# Patient Record
Sex: Male | Born: 1955 | Race: Black or African American | Hispanic: No | Marital: Married | Smoking: Current every day smoker
Health system: Southern US, Community
[De-identification: ages and names within clinical notes are randomized; demographics above are authoritative.]

## PROBLEM LIST (undated history)

## (undated) DIAGNOSIS — F32A Depression, unspecified: Secondary | ICD-10-CM

## (undated) DIAGNOSIS — M109 Gout, unspecified: Secondary | ICD-10-CM

## (undated) DIAGNOSIS — I10 Essential (primary) hypertension: Secondary | ICD-10-CM

## (undated) DIAGNOSIS — F329 Major depressive disorder, single episode, unspecified: Secondary | ICD-10-CM

## (undated) DIAGNOSIS — R45851 Suicidal ideations: Secondary | ICD-10-CM

---

## 2005-03-18 ENCOUNTER — Observation Stay (HOSPITAL_COMMUNITY): Admission: EM | Admit: 2005-03-18 | Discharge: 2005-03-19 | Payer: Self-pay | Admitting: Emergency Medicine

## 2008-02-17 ENCOUNTER — Emergency Department (HOSPITAL_COMMUNITY): Admission: EM | Admit: 2008-02-17 | Discharge: 2008-02-17 | Payer: Self-pay | Admitting: Emergency Medicine

## 2011-02-21 NOTE — Cardiovascular Report (Signed)
NAMEJUNAH, Terry Hill            ACCOUNT NO.:  [id number]   MEDICAL RECORD NO.:  [id number]          PATIENT TYPE:  INP   LOCATION:  2025                         FACILITY:  [facility]   PHYSICIAN:  Nanetta Batty, M.D.   DATE OF BIRTH:  12/26/1955   DATE OF PROCEDURE:  03/19/2005  DATE OF DISCHARGE:                              CARDIAC CATHETERIZATION   [name] is a 55 year old gentleman without prior cardiac history who  was admitted June 13 with chest pain.  He is positive for ongoing tobacco  abuse, hyperlipidemia, and untreated hypertension.  He ruled out for  myocardial infarction by isoenzymes and presents now for diagnostic coronary  arteriography to rule out ischemic etiology.   PROCEDURE DESCRIPTION:  The patient was brought to the second floor [facility] in the postabsorptive state.  He was  premedicated with  p.o. Valium and IV Versed.  His right groin was prepped  and shaved in the usual sterile fashion; 1% Xylocaine was used for local  anesthesia.  A 6-French sheath was inserted into the right femoral artery  using sterile Seldinger technique.  The 6-French right and left Judkins  diagnostic catheters along with 6-French pigtail catheter and no-torque  catheter were used for selective coronary angiography, left  ventriculography, and supravalvular aortography in the LAO view.  Visipaque  dye was used for the entirety of the case. Aorta and left ventricular  pressures were recorded.   HEMODYNAMICS:  1.  Aortic systolic pressure 183, diastolic pressure 99.  2.  Left ventricular systolic pressure 183 and diastolic pressure 23.   SELECTIVE CORONARY ANGIOGRAPHY:  1.  Left main normal.  2.  LAD normal.  3.  Left circumflex is codominant and normal.  4.  Ramus intermedius branch was large and normal.  5.  Right coronary artery was codominant and normal.  6.  There was a second ramus branch which came off from the circumflex      superior to  the larger branch that had approximately a 50% ostial      stenosis.  7.  Right coronary artery.  This vessel was codominant and normal.   LEFT VENTRICULOGRAPHY:  RAO:  Left ventriculography was performed using 25  mL of Visipaque dye at 10 mL per second.  The overall LVEF was 60% without  ventricular wall motion abnormalities.   Supravalvular aortography was performed in the LAO view using 20 mL of  Visipaque dye at 20 mL per second.  There was no AI noted.  There was no  evidence of aortic dissection.   IMPRESSION:  [name] has at most 60% blockage in a medium size ramus  branch which I do not think is of physiologic significance.  Otherwise his  coronary arteries are clean.  He has normal left ventricular function, no  evidence of aortic dissection.  His right femoral arteriotomy site was  closed using STARR closure of the right  system.  Hemostasis was obtained.  He will receive a gram of Ancef  prophylactically and will be discharged home later today with close office  followup.  We will arrange  for him to get outpatient exercise stress testing  prior to being seen back in the office.  He left the lab in stable  condition.       JB/MEDQ  D:  03/19/2005  T:  03/19/2005  Job:  [id number]   cc:   [facility]   [facility]  [address] [place] [address]

## 2014-02-23 DIAGNOSIS — I10 Essential (primary) hypertension: Secondary | ICD-10-CM | POA: Insufficient documentation

## 2014-09-08 ENCOUNTER — Encounter (HOSPITAL_COMMUNITY): Payer: Self-pay | Admitting: *Deleted

## 2014-09-08 ENCOUNTER — Encounter (HOSPITAL_COMMUNITY): Payer: Self-pay | Admitting: Emergency Medicine

## 2014-09-08 ENCOUNTER — Emergency Department (HOSPITAL_COMMUNITY)
Admission: EM | Admit: 2014-09-08 | Discharge: 2014-09-08 | Disposition: A | Discharge status: Other Acute Inpatient Hospital | Payer: 59 | Attending: Emergency Medicine | Admitting: Emergency Medicine

## 2014-09-08 ENCOUNTER — Inpatient Hospital Stay (HOSPITAL_COMMUNITY)
Admission: AD | Admit: 2014-09-08 | Discharge: 2014-09-12 | DRG: 885 | Disposition: A | Discharge status: Home / Self Care | Payer: 59 | Source: Intra-hospital | Attending: Psychiatry | Admitting: Psychiatry

## 2014-09-08 DIAGNOSIS — R45851 Suicidal ideations: Secondary | ICD-10-CM | POA: Diagnosis present

## 2014-09-08 DIAGNOSIS — F1420 Cocaine dependence, uncomplicated: Secondary | ICD-10-CM | POA: Diagnosis present

## 2014-09-08 DIAGNOSIS — F322 Major depressive disorder, single episode, severe without psychotic features: Secondary | ICD-10-CM | POA: Diagnosis not present

## 2014-09-08 DIAGNOSIS — F1410 Cocaine abuse, uncomplicated: Secondary | ICD-10-CM | POA: Insufficient documentation

## 2014-09-08 DIAGNOSIS — F1010 Alcohol abuse, uncomplicated: Secondary | ICD-10-CM | POA: Diagnosis not present

## 2014-09-08 DIAGNOSIS — F341 Dysthymic disorder: Secondary | ICD-10-CM | POA: Diagnosis present

## 2014-09-08 DIAGNOSIS — Z599 Problem related to housing and economic circumstances, unspecified: Secondary | ICD-10-CM | POA: Diagnosis not present

## 2014-09-08 DIAGNOSIS — G4700 Insomnia, unspecified: Secondary | ICD-10-CM | POA: Diagnosis present

## 2014-09-08 DIAGNOSIS — F1020 Alcohol dependence, uncomplicated: Secondary | ICD-10-CM | POA: Diagnosis present

## 2014-09-08 DIAGNOSIS — I10 Essential (primary) hypertension: Secondary | ICD-10-CM | POA: Diagnosis not present

## 2014-09-08 DIAGNOSIS — F17210 Nicotine dependence, cigarettes, uncomplicated: Secondary | ICD-10-CM | POA: Diagnosis present

## 2014-09-08 DIAGNOSIS — Z720 Tobacco use: Secondary | ICD-10-CM | POA: Insufficient documentation

## 2014-09-08 DIAGNOSIS — Y902 Blood alcohol level of 40-59 mg/100 ml: Secondary | ICD-10-CM | POA: Diagnosis present

## 2014-09-08 DIAGNOSIS — F329 Major depressive disorder, single episode, unspecified: Secondary | ICD-10-CM

## 2014-09-08 HISTORY — DX: Essential (primary) hypertension: I10

## 2014-09-08 LAB — CBC
HCT: 41.2 % (ref 39.0–52.0)
Hemoglobin: 13.7 g/dL (ref 13.0–17.0)
MCH: 29.7 pg (ref 26.0–34.0)
MCHC: 33.3 g/dL (ref 30.0–36.0)
MCV: 89.2 fL (ref 78.0–100.0)
Platelets: 230 10*3/uL (ref 150–400)
RBC: 4.62 MIL/uL (ref 4.22–5.81)
RDW: 13.6 % (ref 11.5–15.5)
WBC: 9 10*3/uL (ref 4.0–10.5)

## 2014-09-08 LAB — RAPID URINE DRUG SCREEN, HOSP PERFORMED
Amphetamines: NOT DETECTED
Barbiturates: NOT DETECTED
Benzodiazepines: NOT DETECTED
Cocaine: POSITIVE — AB
Opiates: NOT DETECTED
Tetrahydrocannabinol: NOT DETECTED

## 2014-09-08 LAB — COMPREHENSIVE METABOLIC PANEL
ALT: 25 U/L (ref 0–53)
AST: 21 U/L (ref 0–37)
Albumin: 4.6 g/dL (ref 3.5–5.2)
Alkaline Phosphatase: 73 U/L (ref 39–117)
Anion gap: 18 — ABNORMAL HIGH (ref 5–15)
BUN: 22 mg/dL (ref 6–23)
CO2: 23 mEq/L (ref 19–32)
Calcium: 9.9 mg/dL (ref 8.4–10.5)
Chloride: 96 mEq/L (ref 96–112)
Creatinine, Ser: 1.38 mg/dL — ABNORMAL HIGH (ref 0.50–1.35)
GFR calc Af Amer: 63 mL/min — ABNORMAL LOW (ref >90)
GFR calc non Af Amer: 54 mL/min — ABNORMAL LOW (ref >90)
Glucose, Bld: 96 mg/dL (ref 70–99)
Potassium: 4 mEq/L (ref 3.7–5.3)
Sodium: 137 mEq/L (ref 137–147)
Total Bilirubin: 0.5 mg/dL (ref 0.3–1.2)
Total Protein: 8.5 g/dL — ABNORMAL HIGH (ref 6.0–8.3)

## 2014-09-08 LAB — SALICYLATE LEVEL: Salicylate Lvl: <2 mg/dL — ABNORMAL LOW (ref 2.8–20.0)

## 2014-09-08 LAB — ACETAMINOPHEN LEVEL: Acetaminophen (Tylenol), Serum: <15 ug/mL (ref 10–30)

## 2014-09-08 LAB — ETHANOL: Alcohol, Ethyl (B): 51 mg/dL — ABNORMAL HIGH (ref 0–11)

## 2014-09-08 MED ORDER — LORAZEPAM 1 MG PO TABS: 0.0000 mg | ORAL_TABLET | Freq: Two times a day (BID) | ORAL | Status: DC

## 2014-09-08 MED ORDER — LORAZEPAM 1 MG PO TABS
0.0000 mg | ORAL_TABLET | Freq: Four times a day (QID) | ORAL | Status: DC
  Administered 2014-09-08 (×2): 1 mg via ORAL

## 2014-09-08 MED ORDER — ACETAMINOPHEN 325 MG PO TABS: 650.0000 mg | ORAL_TABLET | Freq: Four times a day (QID) | ORAL | Status: DC | PRN

## 2014-09-08 MED ORDER — TRAZODONE HCL 100 MG PO TABS: 100.0000 mg | ORAL_TABLET | Freq: Every day | ORAL | Status: DC

## 2014-09-08 MED ORDER — FLUOXETINE HCL 20 MG PO CAPS
20.0000 mg | ORAL_CAPSULE | Freq: Every day | ORAL | Status: DC
  Administered 2014-09-09 – 2014-09-11 (×3): 20 mg via ORAL
  Filled 2014-09-08 (×4): qty 1

## 2014-09-08 MED ORDER — ALUM & MAG HYDROXIDE-SIMETH 200-200-20 MG/5ML PO SUSP: 30.0000 mL | ORAL | Status: DC | PRN

## 2014-09-08 MED ORDER — IRBESARTAN 300 MG PO TABS
300.0000 mg | ORAL_TABLET | Freq: Every day | ORAL | Status: DC
  Administered 2014-09-09 – 2014-09-12 (×4): 300 mg via ORAL
  Filled 2014-09-08 (×6): qty 1

## 2014-09-08 MED ORDER — AMLODIPINE BESYLATE 10 MG PO TABS
10.0000 mg | ORAL_TABLET | Freq: Once | ORAL | Status: AC
  Administered 2014-09-08: 10 mg via ORAL
  Filled 2014-09-08: qty 1

## 2014-09-08 MED ORDER — TRAZODONE HCL 100 MG PO TABS
100.0000 mg | ORAL_TABLET | Freq: Every day | ORAL | Status: DC
  Administered 2014-09-08 – 2014-09-09 (×2): 100 mg via ORAL
  Filled 2014-09-08 (×2): qty 1
  Filled 2014-09-08: qty 2
  Filled 2014-09-08 (×3): qty 1

## 2014-09-08 MED ORDER — MAGNESIUM HYDROXIDE 400 MG/5ML PO SUSP: 30.0000 mL | Freq: Every day | ORAL | Status: DC | PRN

## 2014-09-08 MED ORDER — FLUOXETINE HCL 20 MG PO CAPS
20.0000 mg | ORAL_CAPSULE | Freq: Every day | ORAL | Status: DC
  Administered 2014-09-08: 20 mg via ORAL
  Filled 2014-09-08: qty 1

## 2014-09-08 MED ORDER — LORAZEPAM 1 MG PO TABS
0.0000 mg | ORAL_TABLET | Freq: Four times a day (QID) | ORAL | Status: AC
  Administered 2014-09-08 – 2014-09-09 (×5): 1 mg via ORAL
  Filled 2014-09-08 (×4): qty 1

## 2014-09-08 NOTE — ED Notes (Signed)
Report received from Jennifer RN. Pt. Sleeping, respirations regular and unlabored.  Will continue to monitor for safety. Q 15 minute checks continue. 

## 2014-09-08 NOTE — Progress Notes (Signed)
[facility] Minerva AreolaEric, [facility] CSW informed nursing staff of the updated disposition.    Maryelizabeth Rowanressa Manual Navarra, MSW, LCSWA Evening Clinical Social Worker [phone]

## 2014-09-08 NOTE — BH Assessment (Signed)
Assessment Note  Terry Hill is an 58 y.o. male. [facility] because of suicidal ideations with plan and intent.  Per the IVC report, respondent called his employer intoxicated threatening to kill self with a shot gun.  Patient admits to having SI with a gun laying in the bed with in contemplation to harm self.  Patient report he has never thought of harming self although having had symptoms of depression all his life.  Patient reports he had been sober since 2001 until 3 months ago with alcohol and tried snorting cocaine 2 days ago.  Patient reports drinking fifth of rum daily for the last 3 months and purchased a eight ball of cocaine 2 days ago. Patient currently denies SI/HI, hallucinations, and other self-injurious behaviors.    Patient reports his current stressors are his wife and [place] months ago without discussing with him. He reports having financial problems and cars broke down recently.  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    CSW consulted with Dr. Jannifer FranklinAkintayo it is recommended to refer inpatient for safety and stabilization.    Axis I: Major Depression, single episode and Alcohol use and Cocaine use mild Axis II: Deferred Axis III:  Past Medical History  Diagnosis Date  . Hypertension    Axis IV: economic problems, other psychosocial or environmental problems, problems related to social environment, problems with access to health care services and problems with primary support group Axis V: 41-50 serious symptoms  Past Medical History:  Past Medical History  Diagnosis Date  . Hypertension     No past surgical history on file.  Family History: No family history on file.  Social History:  reports that he has been smoking Cigarettes.  He has been smoking about 0.00 packs per day. He does not have any smokeless tobacco history on  file. He reports that he drinks alcohol. His drug history is not on file.  Additional Social History:     CIWA: CIWA-Ar BP: 167/86 mmHg Pulse Rate: 105 COWS:    Allergies:  Allergies  Allergen Reactions  . Sulfur Hives and Rash    Home Medications:  (Not in a hospital admission)  OB/GYN Status:  No LMP for male patient.  General Assessment Data Location of Assessment: [facility] ACT Assessment: Yes Is this a Tele or Face-to-Face Assessment?: Face-to-Face Is this an Initial Assessment or a Re-assessment for this encounter?: Initial Assessment Living Arrangements: Alone Can pt return to current living arrangement?: Yes Admission Status: Involuntary Is patient capable of signing voluntary admission?: No Transfer from: Home Referral Source: Self/Family/Friend  Medical Screening Exam [facility] Walk-in ONLY) Medical Exam completed: Yes  [facility] Living Arrangements: Alone Name of Psychiatrist: none Name of Therapist: none  Education Status Is patient currently in school?: No  Risk to self with the past 6 months Suicidal Ideation: No (Pt denies currently although had shot gun with intent) Suicidal Intent: No (Pt had shot gun load with inten but denies at this time) Is patient at risk for suicide?: Yes Suicidal Plan?: No (Shot load with intent) Access to Means: Yes Specify Access to Suicidal Means: shot in home What has been your use of drugs/alcohol within the last 12 months?: alcohol, cocaine Previous Attempts/Gestures: No Triggers for Past Attempts: Family contact, Spouse contact Intentional Self Injurious Behavior: None Family Suicide History: No Recent stressful  life event(s): Conflict (Comment), Loss (Comment), Financial Problems Persecutory voices/beliefs?: No Depression: Yes Depression Symptoms: Tearfulness, Guilt, Fatigue, Loss of interest in usual pleasures, Feeling angry/irritable, Feeling worthless/self pity Substance abuse history and/or treatment for  substance abuse?: Yes  Risk to Others within the past 6 months Homicidal Ideation: No-Not Currently/Within Last 6 Months Thoughts of Harm to Others: No-Not Currently Present/Within Last 6 Months Current Homicidal Intent: No-Not Currently/Within Last 6 Months Current Homicidal Plan: No-Not Currently/Within Last 6 Months History of harm to others?: No Assessment of Violence: None Noted Does patient have access to weapons?: Yes (Comment) Criminal Charges Pending?: No Does patient have a court date: No  Psychosis Hallucinations: None noted Delusions: None noted  Mental Status Report Appear/Hygiene: In hospital gown Eye Contact: Fair Motor Activity: Tremors Speech: Pressured Level of Consciousness: Alert Mood: Anxious, Depressed Affect: Anxious, Depressed Anxiety Level: Moderate Thought Processes: Coherent Judgement: Unimpaired Orientation: Person, Place, Time, Situation Obsessive Compulsive Thoughts/Behaviors: None  Cognitive Functioning Concentration: Fair Memory: Recent Intact, Remote Intact IQ: Average Insight: Fair Impulse Control: Fair Appetite: Fair Sleep: Decreased Vegetative Symptoms: None  ADLScreening [facility]) Patient's cognitive ability adequate to safely complete daily activities?: Yes Patient able to express need for assistance with ADLs?: Yes Independently performs ADLs?: Yes (appropriate for developmental age)  Prior Inpatient Therapy Prior Inpatient Therapy: No  Prior Outpatient Therapy Prior Outpatient Therapy: No  ADL Screening (condition at time of admission) Patient's cognitive ability adequate to safely complete daily activities?: Yes Patient able to express need for assistance with ADLs?: Yes Independently performs ADLs?: Yes (appropriate for developmental age)             Advance Directives (For Healthcare) Does patient have an advance directive?: No Would patient like information on creating an advanced directive?:  No - patient declined information    Additional Information 1:1 In Past 12 Months?: No CIRT Risk: No Elopement Risk: No Does patient have medical clearance?: Yes     Disposition:  Disposition Disposition of Patient: Other dispositions Other disposition(s): Other (Comment) (Pending disposition)  On Site Evaluation by:   Reviewed with Physician:    Maryelizabeth Rowanorbett, Karla Vines A 09/08/2014 10:06 AM

## 2014-09-08 NOTE — ED Notes (Signed)
[facility] [place] [facility] [place] [facility].  Per Deputy IVC papers are in process at this time. Pt told his boss this morning that he was going to kill himself and when sheriff dept arrived to pt's house, pt had loaded shot gun on his bed.   Pt states that last night "things just got too much and came to a head".  Pt denies having a plan or HI to me at this time.  Pt states that "here lately been using alcohol and tobacco everyday".

## 2014-09-08 NOTE — Tx Team (Signed)
Initial Interdisciplinary Treatment Plan   PATIENT STRESSORS: Marital or family conflict Substance abuse   PATIENT STRENGTHS: Ability for insight Average or above average intelligence Capable of independent living General fund of knowledge   PROBLEM LIST: Problem List/Patient Goals Date to be addressed Date deferred Reason deferred Estimated date of resolution  Depression 09/08/14     "drinking too much" 09/08/14                                                DISCHARGE CRITERIA:  Ability to meet basic life and health needs Improved stabilization in mood, thinking, and/or behavior Verbal commitment to aftercare and medication compliance Withdrawal symptoms are absent or subacute and managed without 24-hour nursing intervention  PRELIMINARY DISCHARGE PLAN: Attend aftercare/continuing care group Return to previous living arrangement  PATIENT/FAMIILY INVOLVEMENT: This treatment plan has been presented to and reviewed with the patient, Terry Hill, and/or family member, .  The patient and family have been given the opportunity to ask questions and make suggestions.  Genevie Elman, [place] 09/08/2014, 9:53 PM

## 2014-09-08 NOTE — ED Notes (Signed)
Patient is pleasant on approach today. Reports increased stress and some financial issues. Says he did have a moment where he thought about suicide but denies at present. Reports that he is struggling with ETOH and cocaine abuse. Reports some tremors at present. Patient started on ativan protocol. Cooperative on the unit.

## 2014-09-08 NOTE — Consult Note (Signed)
[place] [facility] Consult   Reason for Consult:  Suicidal ideation with plan to shoot self Referring Physician:  EDP  Hill Terry is an 58 y.o. male. Total Time spent with patient: 45 minutes  Assessment: AXIS I:  Major Depression, single episode AXIS II:  Deferred AXIS III:   Past Medical History  Diagnosis Date  . Hypertension    AXIS IV:  other psychosocial or environmental problems and problems related to social environment AXIS V:  31-40 impairment in reality testing  Plan:  Recommend psychiatric Inpatient admission when medically cleared.  Subjective:   Terry Hill is a 58 y.o. male patient admitted with suicidal ideation and a plan to shoot him self  HPI:  Mr. Terry Hill is a 58 year old African American Male who endorses a long history of dysthymia.  States "I have always been depressed"  Reports that yesterday he had a bad day and had thoughts to kill himself. Recent stressors include wife and family moving to [place] several months ago with patient's depressing worsening over this time.  [name] reports that he was ready to "end it all"  Patient had a loaded shot gun in bed with him and was thinking about pulling the trigger but called his boss at work  who contacted the sheriff.   He has been drinking alcohol to self medicate. Reports he drinks a fifth of liquor daily to every other day.  Reports tremors and feeling shaky this am.  Endorses some cocaine use recently. Patient endorses many symptoms of depression including anhedonia, worthlessness, guilt.  Denies Auditory or Visual hallucinations.  NO evidence of delusions HPI Elements:   Location:  generalized. Quality:  acute. Severity:  severe. Timing:  constant . Duration:  increasing in intensity for past three months. Context:  family moving to [place]. .  Past Psychiatric History: Past Medical History  Diagnosis Date  . Hypertension     reports that he has been smoking Cigarettes.  He has  been smoking about 0.00 packs per day. He does not have any smokeless tobacco history on file. He reports that he drinks alcohol. His drug history is not on file. No family history on file. Family History Substance Abuse: Yes, Describe: (alcohol, cocaine) Family Supports: Yes, List: (daughter) Living Arrangements: Alone Can pt return to current living arrangement?: Yes   Allergies:   Allergies  Allergen Reactions  . Sulfur Hives and Rash    ACT Assessment Complete:  Yes:    Educational Status    Risk to Self: Risk to self with the past 6 months Suicidal Ideation: No (Pt denies currently although had shot gun with intent) Suicidal Intent: No (Pt had shot gun load with inten but denies at this time) Is patient at risk for suicide?: Yes Suicidal Plan?: No (Shot load with intent) Access to Means: Yes Specify Access to Suicidal Means: shot in home What has been your use of drugs/alcohol within the last 12 months?: alcohol, cocaine Previous Attempts/Gestures: No Triggers for Past Attempts: Family contact, Spouse contact Intentional Self Injurious Behavior: None Family Suicide History: No Recent stressful life event(s): Conflict (Comment), Loss (Comment), Financial Problems Persecutory voices/beliefs?: No Depression: Yes Depression Symptoms: Tearfulness, Guilt, Fatigue, Loss of interest in usual pleasures, Feeling angry/irritable, Feeling worthless/self pity Substance abuse history and/or treatment for substance abuse?: Yes  Risk to Others: Risk to Others within the past 6 months Homicidal Ideation: No-Not Currently/Within Last 6 Months Thoughts of Harm to Others: No-Not Currently Present/Within Last  6 Months Current Homicidal Intent: No-Not Currently/Within Last 6 Months Current Homicidal Plan: No-Not Currently/Within Last 6 Months History of harm to others?: No Assessment of Violence: None Noted Does patient have access to weapons?: Yes (Comment) Criminal Charges Pending?:  No Does patient have a court date: No  Abuse:    Prior Inpatient Therapy: Prior Inpatient Therapy Prior Inpatient Therapy: No  Prior Outpatient Therapy: Prior Outpatient Therapy Prior Outpatient Therapy: No  Additional Information: Additional Information 1:1 In Past 12 Months?: No CIRT Risk: No Elopement Risk: No Does patient have medical clearance?: Yes                  Objective: Blood pressure 167/86, pulse 105, temperature 97.5 F (36.4 C), temperature source Oral, resp. rate 20, SpO2 99 %.There is no height or weight on file to calculate BMI. Results for orders placed or performed during the hospital encounter of 09/08/14 (from the past 72 hour(s))  Urine Drug Screen     Status: Abnormal   Collection Time: 09/08/14  7:44 AM  Result Value Ref Range   Opiates NONE DETECTED NONE DETECTED   Cocaine POSITIVE (A) NONE DETECTED   Benzodiazepines NONE DETECTED NONE DETECTED   Amphetamines NONE DETECTED NONE DETECTED   Tetrahydrocannabinol NONE DETECTED NONE DETECTED   Barbiturates NONE DETECTED NONE DETECTED    Comment:        DRUG SCREEN FOR MEDICAL PURPOSES ONLY.  IF CONFIRMATION IS NEEDED FOR ANY PURPOSE, NOTIFY LAB WITHIN 5 DAYS.        LOWEST DETECTABLE LIMITS FOR URINE DRUG SCREEN Drug Class       Cutoff (ng/mL) Amphetamine      1000 Barbiturate      200 Benzodiazepine   841 Tricyclics       660 Opiates          300 Cocaine          300 THC              50   Acetaminophen level     Status: None   Collection Time: 09/08/14  8:07 AM  Result Value Ref Range   Acetaminophen (Tylenol), Serum <15.0 10 - 30 ug/mL    Comment:        THERAPEUTIC CONCENTRATIONS VARY SIGNIFICANTLY. A RANGE OF 10-30 ug/mL MAY BE AN EFFECTIVE CONCENTRATION FOR MANY PATIENTS. HOWEVER, SOME ARE BEST TREATED AT CONCENTRATIONS OUTSIDE THIS RANGE. ACETAMINOPHEN CONCENTRATIONS >150 ug/mL AT 4 HOURS AFTER INGESTION AND >50 ug/mL AT 12 HOURS AFTER INGESTION ARE OFTEN ASSOCIATED  WITH TOXIC REACTIONS.   CBC     Status: None   Collection Time: 09/08/14  8:07 AM  Result Value Ref Range   WBC 9.0 4.0 - 10.5 K/uL   RBC 4.62 4.22 - 5.81 MIL/uL   Hemoglobin 13.7 13.0 - 17.0 g/dL   HCT 41.2 39.0 - 52.0 %   MCV 89.2 78.0 - 100.0 fL   MCH 29.7 26.0 - 34.0 pg   MCHC 33.3 30.0 - 36.0 g/dL   RDW 13.6 11.5 - 15.5 %   Platelets 230 150 - 400 K/uL  Comprehensive metabolic panel     Status: Abnormal   Collection Time: 09/08/14  8:07 AM  Result Value Ref Range   Sodium 137 137 - 147 mEq/L   Potassium 4.0 3.7 - 5.3 mEq/L   Chloride 96 96 - 112 mEq/L   CO2 23 19 - 32 mEq/L   Glucose, Bld 96 70 - 99 mg/dL   BUN  22 6 - 23 mg/dL   Creatinine, Ser 6.60 (H) 0.50 - 1.35 mg/dL   Calcium 9.9 8.4 - 63.0 mg/dL   Total Protein 8.5 (H) 6.0 - 8.3 g/dL   Albumin 4.6 3.5 - 5.2 g/dL   AST 21 0 - 37 U/L   ALT 25 0 - 53 U/L   Alkaline Phosphatase 73 39 - 117 U/L   Total Bilirubin 0.5 0.3 - 1.2 mg/dL   GFR calc non Af Amer 54 (L) >90 mL/min   GFR calc Af Amer 63 (L) >90 mL/min    Comment: (NOTE) The eGFR has been calculated using the CKD EPI equation. This calculation has not been validated in all clinical situations. eGFR's persistently <90 mL/min signify possible Chronic Kidney Disease.    Anion gap 18 (H) 5 - 15  Ethanol (ETOH)     Status: Abnormal   Collection Time: 09/08/14  8:07 AM  Result Value Ref Range   Alcohol, Ethyl (B) 51 (H) 0 - 11 mg/dL    Comment:        LOWEST DETECTABLE LIMIT FOR SERUM ALCOHOL IS 11 mg/dL FOR MEDICAL PURPOSES ONLY   Salicylate level     Status: Abnormal   Collection Time: 09/08/14  8:07 AM  Result Value Ref Range   Salicylate Lvl <2.0 (L) 2.8 - 20.0 mg/dL   Labs are reviewed and are pertinent for UDS positive for cocaine.  ETOH 51 on admission to ER.   Slightly elevated creatinine at 1.38  Current Facility-Administered Medications  Medication Dose Route Frequency Provider Last Rate Last Dose  . FLUoxetine (PROZAC) capsule 20 mg  20 mg  Oral Daily Nathalee Smarr      . LORazepam (ATIVAN) tablet 0-4 mg  0-4 mg Oral 4 times per day Fayrene Helper, PA-C       Followed by  . [START ON 09/10/2014] LORazepam (ATIVAN) tablet 0-4 mg  0-4 mg Oral Q12H Fayrene Helper, PA-C      . traZODone (DESYREL) tablet 100 mg  100 mg Oral QHS Marieliz Strang       No current outpatient prescriptions on file.    Psychiatric Specialty Exam:     Blood pressure 167/86, pulse 105, temperature 97.5 F (36.4 C), temperature source Oral, resp. rate 20, SpO2 99 %.There is no height or weight on file to calculate BMI.  General Appearance: Casual  Eye Contact::  Minimal  Speech:  Clear and Coherent  Normal rate rhythm and [place]ody  Volume:  Normal  Mood:  Depressed and Hopeless  Affect:  Congruent  Thought Process:  Coherent, Goal Directed and Logical  Orientation:  Full (Time, Place, and Person)  Thought Content:  Rumination  Suicidal Thoughts:  Yes.  with intent/plan  Homicidal Thoughts:  No  Memory:  Immediate;   Good Recent;   Good Remote;   Good  Judgement:  Fair  Insight:  Fair  Psychomotor Activity:  Decreased  Concentration:  Good  Recall:  Good  Fund of Knowledge:Good  Language: Good  Akathisia:  No  Handed:  Right  AIMS (if indicated):     Assets:  Communication Skills Desire for Improvement Housing Vocational/Educational  Sleep:      Musculoskeletal: Strength & Muscle Tone: within normal limits Gait & Station: normal Patient leans: N/A  Treatment Plan Summary: Daily contact with patient to assess and evaluate symptoms and progress in treatment Medication management admit for inpatient stabilization of mood and to monitor for alcohol withdrawal   Bonnetta Barry  PMH-NP  09/08/2014 11:44 AM  Patient seen, evaluated and I agree with notes by Nurse Practitioner. Corena Pilgrim, MD

## 2014-09-08 NOTE — ED Provider Notes (Signed)
CSN: [id number]     Arrival date & time 09/08/14  0725 History   First MD Initiated Contact with Patient 09/08/14 [phone]     Chief Complaint  Patient presents with  . IVC   . Suicidal     (Consider location/radiation/quality/duration/timing/severity/associated sxs/prior Treatment) HPI   58 year old male BIB [employer].  IVC paper are in process at this time.  Pt report since his wife and family left him to move to [place] 3 months ago he has been very depressed.  He has been drinking alcohol to self medicate.  Yesterday he also use cocaine, which he normally doesn't.  Sts he just wanted to "ended it all".  This AM he called his boss stating that he's drunk and cannot come in.  He later confined with his boss that he is going to kill himself.  Boss contacted [employer] sheriff.  Sheriff was dispatched, and found pt inside his house with a loaded shot gun next to his side.  Pt did sts he thought about killing himself but unable to pull the trigger.  He denies HI or hallucination.  Admits to drinking a fifth of liquor last night.  He denies having any pain.  He's a smoker.    Past Medical History  Diagnosis Date  . Hypertension    No past surgical history on file. No family history on file. History  Substance Use Topics  . Smoking status: Current Every Day Smoker    Types: Cigarettes  . Smokeless tobacco: Not on file  . Alcohol Use: Yes    Review of Systems  All other systems reviewed and are negative.     Allergies  Sulfur  Home Medications   Prior to Admission medications   Not on File   BP 167/86 mmHg  Pulse 105  Temp(Src) 97.5 F (36.4 C) (Oral)  Resp 20  SpO2 99% Physical Exam  Constitutional: He appears well-developed and well-nourished. No distress.  HENT:  Head: Atraumatic.  Breath smell of ETOH  Eyes: Conjunctivae and EOM are normal. Pupils are equal, round, and reactive to light.  Neck: Normal range of motion. Neck supple.  Cardiovascular: Normal  rate and regular rhythm.   Pulmonary/Chest: Effort normal and breath sounds normal.  Abdominal: Soft. There is no tenderness.  Neurological: He is alert. GCS eye subscore is 4. GCS verbal subscore is 5. GCS motor subscore is 6.  Skin: No rash noted.  Psychiatric: His speech is normal. He is withdrawn. Thought content is not paranoid and not delusional. Cognition and memory are normal. He exhibits a depressed mood. He expresses no homicidal and no suicidal ideation.    ED Course  Procedures (including critical care time)  8:17 AM Pt with Suicidal threat, but at this time no active SI.  Pt admits to feeling depressed.  WIll perform med clearance and will consult TTS for further management.  IVC paper has been filled.    Labs Review Labs Reviewed  COMPREHENSIVE METABOLIC PANEL - Abnormal; Notable for the following:    Creatinine, Ser 1.38 (*)    Total Protein 8.5 (*)    GFR calc non Af Amer 54 (*)    GFR calc Af Amer 63 (*)    Anion gap 18 (*)    All other components within normal limits  ETHANOL - Abnormal; Notable for the following:    Alcohol, Ethyl (B) 51 (*)    All other components within normal limits  SALICYLATE LEVEL - Abnormal;  Notable for the following:    Salicylate Lvl <2.0 (*)    All other components within normal limits  URINE RAPID DRUG SCREEN (HOSP PERFORMED) - Abnormal; Notable for the following:    Cocaine POSITIVE (*)    All other components within normal limits  ACETAMINOPHEN LEVEL  CBC    Imaging Review No results found.   EKG Interpretation None      MDM   Final diagnoses:  Suicidal ideation  Alcohol abuse    BP 167/86 mmHg  Pulse 105  Temp(Src) 97.5 F (36.4 C) (Oral)  Resp 20  SpO2 99%     Fayrene HelperBowie Tonya Carlile, PA-C 09/08/14 1405  Raeford RazorStephen Kohut, MD 09/15/14 [phone]

## 2014-09-08 NOTE — ED Notes (Signed)
Pt. Transferred to [facility] with [employer]. 

## 2014-09-08 NOTE — Progress Notes (Signed)
58 year old male admitted on involuntary basis after making suicidal statements and reports that a friend of his was concerned and had him committed. Pt does appear depressed on admission but denies any SI and spoke about how it was blown out of proportion and how he is embarrassed. Pt does endorse increased alcohol consumption and spoke about how he drinks a bottle a day until it's gone. Pt did mention family issues and the only person he has left in the area is his daughter. Pt is able to contract for safety on the unit. Pt was oriented to the unit and safety maintained.

## 2014-09-09 DIAGNOSIS — F329 Major depressive disorder, single episode, unspecified: Secondary | ICD-10-CM

## 2014-09-09 NOTE — Progress Notes (Signed)
Did not attend group 

## 2014-09-09 NOTE — Progress Notes (Signed)
D Pt. Denies SI and HI,no complaints of pain or discomfort noted at this time.   A  Writer offered support and encouragement,  Discussed pt.'s goals and plans.   R Pt. Remains safe on the unit, states going back to work is a concern stating "People are nosy, but I only have to answer to  One person". Rates his anxiety at a 7 and depression at a 6.  Pt. Slept most of the evening although he was sitting in the dayroom, he even slept through wrap up group.

## 2014-09-09 NOTE — Progress Notes (Signed)
Patient ID: Chrissie NoaForrest L Zagal, male   DOB: 06/10/1956, [age] y.o.   MRN: [id number] D. Patient presents with depressed mood, affect irritable Beatriz has been cooperative with staff but states to writer '' I just want to talk to the doctor about going home, all of this is being blown out of proportion. i was never a drinking that missed work, I always made it into work. '' Pt denied withdrawal symptoms today. He denies any SI/HI and is able to contract for safety. A. Medications given as ordered. Support encouragement provided. Discussed patient progress with Dr. Tawni CarnesSaranga. R. Patient is safe, in no acute distress at this time. Will continue to monitor q 15 minutes for safety.

## 2014-09-09 NOTE — [facility] Suicide Risk Assessment (Signed)
   Nursing information obtained from:    Demographic factors:    Current Mental Status:    Loss Factors:    Historical Factors:    Risk Reduction Factors:    Total Time spent with patient: 1 hour  CLINICAL FACTORS:   Depression:   Comorbid alcohol abuse/dependence  Psychiatric Specialty Exam: Physical Exam  ROS  Blood pressure 106/66, pulse 78, temperature 97.7 F (36.5 C), temperature source Oral, resp. rate 16, height 6\' 2"  (1.88 m), weight 84.823 kg (187 lb).Body mass index is 24 kg/(m^2).  General Appearance: Casual  Eye Contact::  Fair  Speech:  Normal Rate  Volume:  Normal  Mood:  Depressed  Affect:  Depressed  Thought Process:  Goal Directed  Orientation:  Full (Time, Place, and Person)  Thought Content:  Negative  Suicidal Thoughts:  No  Homicidal Thoughts:  No  Memory:  Negative  Judgement:  Poor  Insight:  Shallow  Psychomotor Activity:  Normal  Concentration:  Fair  Recall:  Fair  Fund of Knowledge:Fair  Language: Fair  Akathisia:  Negative  Handed:  Right  AIMS (if indicated):     Assets:  Communication Skills Desire for Improvement  Sleep:      Musculoskeletal: Strength & Muscle Tone: within normal limits Gait & Station: normal Patient leans: N/A  COGNITIVE FEATURES THAT CONTRIBUTE TO RISK:  Closed-mindedness Loss of executive function Polarized thinking Thought constriction (tunnel vision)    SUICIDE RISK:   Extreme:  Frequent, intense, and enduring suicidal ideation, specific plans, clear subjective and objective intent, impaired self-control, severe dysphoria/symptomatology, many risk factors and no protective factors.  PLAN OF CARE:  I certify that inpatient services furnished can reasonably be expected to improve the patient's condition.  Ancil LinseySARANGA, Terry Hill 09/09/2014, 4:08 PM

## 2014-09-09 NOTE — [facility] Group Notes (Signed)
[facility] Group Notes:  (Nursing/MHT/Case Management/Adjunct)  Date:  09/09/2014  Time: 0390 am  Type of Therapy:  Psychoeducational Skills  Participation Level:  Active  Participation Quality:  Appropriate  Affect:  Appropriate  Cognitive:  Alert  Insight:  Appropriate  Engagement in Group:  Supportive  Modes of Intervention:  Support  Summary of Progress/Problems:  Cranford MonBeaudry, Samiel Peel Evans 09/09/2014, 10:52 AM

## 2014-09-09 NOTE — H&P (Signed)
Psychiatric Admission Assessment Adult  Patient Identification:  Terry Hill Date of Evaluation:  09/09/2014 Chief Complaint:  MDD, SINGLE EPISODE History of Present Illness: Terry Hill is a 58 year old African American Male who endorses a long history of dysthymia. States "I have always been depressed" Reports prior to admission he had a bad day and had thoughts to kill himself. Recent stressors include wife and family moving to [place] several months ago with patient's depressing worsening over this time. Pt felt "everything was closing in on me". Terry Hill reports that he was ready to "end it all and put a bullet in my head" Patient had a loaded shot gun in bed with him and was thinking about pulling the trigger but called his boss at work who contacted the sheriff. Police broke down his door, and found him with the loaded shotgun on his bed. He has been drinking alcohol to self medicate. Reports he drinks a fifth of liquor daily to every other day.Denies current withdrawal symptoms. Endorses some cocaine use recently (to alleviate his pain). Patient endorses many symptoms of depression including anhedonia, worthlessness, guilt. Denies Auditory or Visual hallucinations. NO evidence of delusions.  Today, pt reports feeling "better than yesterday". Pt denies current SI/HI/AVH. He drank alcohol to help him sleep. Appetite is moderate. He feels "a little woozy" since starting prozac, but this is tolerable.   Elements:  Location:  depression. Quality:  severe. Severity:  severe. Timing:  worsened recently. Duration:  worsened over last few weeks. Context:  see above. Associated Signs/Synptoms: Depression Symptoms:  depressed mood, anhedonia, insomnia, psychomotor retardation, fatigue, feelings of worthlessness/guilt, difficulty concentrating, hopelessness, suicidal thoughts with specific plan, (Hypo) Manic Symptoms: none Anxiety Symptoms: denies Psychotic Symptoms:   denies PTSD Symptoms: denies Total Time spent with patient: 1 hour  Psychiatric Specialty Exam: Physical Exam  ROS  Blood pressure 106/66, pulse 78, temperature 97.7 F (36.5 C), temperature source Oral, resp. rate 16, height $RemoveBe'6\' 2"'rMrBxcLSY$  (1.88 m), weight 84.823 kg (187 lb).Body mass index is 24 kg/(m^2).  General Appearance: Casual  Eye Contact::  Fair  Speech:  Clear and Coherent  Volume:  Normal  Mood:  Depressed  Affect:  Congruent, Constricted and Depressed  Thought Process:  Goal Directed  Orientation:  Full (Time, Place, and Person)  Thought Content:  Negative  Suicidal Thoughts:  No  Homicidal Thoughts:  No  Memory:  Negative  Judgement:  Poor  Insight:  Shallow  Psychomotor Activity:  Normal  Concentration:  Fair  Recall:  [place] of Knowledge:Fair  Language: Fair  Akathisia:  Negative  Handed:  Right  AIMS (if indicated):     Assets:  Communication Skills Desire for Improvement  Sleep:       Musculoskeletal: Strength & Muscle Tone: within normal limits Gait & Station: normal Patient leans: N/A  Past Psychiatric History: Diagnosis:  Hospitalizations:  Outpatient Care:  Substance Abuse Care:  Self-Mutilation:  Suicidal Attempts:  Violent Behaviors:   Past Medical History:   Past Medical History  Diagnosis Date  . Hypertension    None. Allergies:   Allergies  Allergen Reactions  . Sulfur Hives and Rash   PTA Medications: Prescriptions prior to admission  Medication Sig Dispense Refill Last Dose  . amLODipine (NORVASC) 10 MG tablet Take 10 mg by mouth daily with breakfast.   09/07/2014 at Unknown time  . olmesartan-hydrochlorothiazide (BENICAR HCT) 40-25 MG per tablet Take 1 tablet by mouth daily with breakfast.   09/07/2014 at Unknown  time  . tadalafil (CIALIS) 5 MG tablet Take 5 mg by mouth daily with breakfast.   09/07/2014 at Unknown time    Previous Psychotropic Medications:  Medication/Dose                 Substance Abuse History  in the last 12 months:  Yes.    Consequences of Substance Abuse: Negative  Social History:  reports that he has been smoking Cigarettes.  He has been smoking about 0.50 packs per day. He does not have any smokeless tobacco history on file. He reports that he drinks alcohol. His drug history is not on file. Additional Social History:                      Current Place of Residence:   Place of Birth:   Family Members: Marital Status:  Married Children:  Sons:  Daughters: Relationships: Education:  not known Educational Problems/Performance: Religious Beliefs/Practices: History of Abuse (Emotional/Phsycial/Sexual) Ship broker History:  None. Legal History: Hobbies/Interests:  Family History:  History reviewed. No pertinent family history.  Results for orders placed or performed during the hospital encounter of 09/08/14 (from the past 72 hour(s))  Urine Drug Screen     Status: Abnormal   Collection Time: 09/08/14  7:44 AM  Result Value Ref Range   Opiates NONE DETECTED NONE DETECTED   Cocaine POSITIVE (A) NONE DETECTED   Benzodiazepines NONE DETECTED NONE DETECTED   Amphetamines NONE DETECTED NONE DETECTED   Tetrahydrocannabinol NONE DETECTED NONE DETECTED   Barbiturates NONE DETECTED NONE DETECTED    Comment:        DRUG SCREEN FOR MEDICAL PURPOSES ONLY.  IF CONFIRMATION IS NEEDED FOR ANY PURPOSE, NOTIFY LAB WITHIN 5 DAYS.        LOWEST DETECTABLE LIMITS FOR URINE DRUG SCREEN Drug Class       Cutoff (ng/mL) Amphetamine      1000 Barbiturate      200 Benzodiazepine   622 Tricyclics       633 Opiates          300 Cocaine          300 THC              50   Acetaminophen level     Status: None   Collection Time: 09/08/14  8:07 AM  Result Value Ref Range   Acetaminophen (Tylenol), Serum <15.0 10 - 30 ug/mL    Comment:        THERAPEUTIC CONCENTRATIONS VARY SIGNIFICANTLY. A RANGE OF 10-30 ug/mL MAY BE AN EFFECTIVE CONCENTRATION  FOR MANY PATIENTS. HOWEVER, SOME ARE BEST TREATED AT CONCENTRATIONS OUTSIDE THIS RANGE. ACETAMINOPHEN CONCENTRATIONS >150 ug/mL AT 4 HOURS AFTER INGESTION AND >50 ug/mL AT 12 HOURS AFTER INGESTION ARE OFTEN ASSOCIATED WITH TOXIC REACTIONS.   CBC     Status: None   Collection Time: 09/08/14  8:07 AM  Result Value Ref Range   WBC 9.0 4.0 - 10.5 K/uL   RBC 4.62 4.22 - 5.81 MIL/uL   Hemoglobin 13.7 13.0 - 17.0 g/dL   HCT 41.2 39.0 - 52.0 %   MCV 89.2 78.0 - 100.0 fL   MCH 29.7 26.0 - 34.0 pg   MCHC 33.3 30.0 - 36.0 g/dL   RDW 13.6 11.5 - 15.5 %   Platelets 230 150 - 400 K/uL  Comprehensive metabolic panel     Status: Abnormal   Collection Time: 09/08/14  8:07 AM  Result Value Ref Range  Sodium 137 137 - 147 mEq/L   Potassium 4.0 3.7 - 5.3 mEq/L   Chloride 96 96 - 112 mEq/L   CO2 23 19 - 32 mEq/L   Glucose, Bld 96 70 - 99 mg/dL   BUN 22 6 - 23 mg/dL   Creatinine, Ser 1.38 (H) 0.50 - 1.35 mg/dL   Calcium 9.9 8.4 - 10.5 mg/dL   Total Protein 8.5 (H) 6.0 - 8.3 g/dL   Albumin 4.6 3.5 - 5.2 g/dL   AST 21 0 - 37 U/L   ALT 25 0 - 53 U/L   Alkaline Phosphatase 73 39 - 117 U/L   Total Bilirubin 0.5 0.3 - 1.2 mg/dL   GFR calc non Af Amer 54 (L) >90 mL/min   GFR calc Af Amer 63 (L) >90 mL/min    Comment: (NOTE) The eGFR has been calculated using the CKD EPI equation. This calculation has not been validated in all clinical situations. eGFR's persistently <90 mL/min signify possible Chronic Kidney Disease.    Anion gap 18 (H) 5 - 15  Ethanol (ETOH)     Status: Abnormal   Collection Time: 09/08/14  8:07 AM  Result Value Ref Range   Alcohol, Ethyl (B) 51 (H) 0 - 11 mg/dL    Comment:        LOWEST DETECTABLE LIMIT FOR SERUM ALCOHOL IS 11 mg/dL FOR MEDICAL PURPOSES ONLY   Salicylate level     Status: Abnormal   Collection Time: 09/08/14  8:07 AM  Result Value Ref Range   Salicylate Lvl <9.4 (L) 2.8 - 20.0 mg/dL   Psychological Evaluations:  Assessment:    DSM5:   Depressive Disorders:  Major Depressive Disorder - Severe (296.23)  AXIS I:  Major Depression, single episode AXIS II:  Deferred AXIS III:   Past Medical History  Diagnosis Date  . Hypertension    AXIS IV:  other psychosocial or environmental problems AXIS V:  21-30 behavior considerably influenced by delusions or hallucinations OR serious impairment in judgment, communication OR inability to function in almost all areas  Treatment Plan/Recommendations:  Admit for safety/stabilization. Continue prozac for depression. PRN ativan for alcohol w/d symptoms.  Treatment Plan Summary: Daily contact with patient to assess and evaluate symptoms and progress in treatment Medication management Current Medications:  Current Facility-Administered Medications  Medication Dose Route Frequency Provider Last Rate Last Dose  . acetaminophen (TYLENOL) tablet 650 mg  650 mg Oral Q6H PRN Waylan Boga, NP      . alum & mag hydroxide-simeth (MAALOX/MYLANTA) 200-200-20 MG/5ML suspension 30 mL  30 mL Oral Q4H PRN Waylan Boga, NP      . FLUoxetine (PROZAC) capsule 20 mg  20 mg Oral Daily Waylan Boga, NP   20 mg at 09/09/14 0859  . irbesartan (AVAPRO) tablet 300 mg  300 mg Oral Daily Waylan Boga, NP   300 mg at 09/09/14 0859  . LORazepam (ATIVAN) tablet 0-4 mg  0-4 mg Oral 4 times per day Waylan Boga, NP   1 mg at 09/09/14 1210   Followed by  . [START ON 09/10/2014] LORazepam (ATIVAN) tablet 0-4 mg  0-4 mg Oral Q12H Waylan Boga, NP      . magnesium hydroxide (MILK OF MAGNESIA) suspension 30 mL  30 mL Oral Daily PRN Waylan Boga, NP      . traZODone (DESYREL) tablet 100 mg  100 mg Oral QHS Waylan Boga, NP   100 mg at 09/08/14 2234    Observation Level/Precautions:  15 minute checks  Laboratory:  UDS +cocaine, BAL 51  Psychotherapy:    Medications:  Cont prozac for depression. PRN ativan for alcohol w/d sxs.  Consultations:    Discharge Concerns:    Estimated LOS:  Other:     I certify  that inpatient services furnished can reasonably be expected to improve the patient's condition.   Dereck Leep 12/5/20153:44 PM

## 2014-09-09 NOTE — [facility] Group Notes (Signed)
[facility] LCSW Group Therapy Note  09/09/2014 11:15 AM  Type of Therapy and Topic:  Group Therapy: Avoiding Self-Sabotaging and Enabling Behaviors  Participation Level:  Active  Mood: Depressed, irritable  Description of Group:     Learn how to identify obstacles, self-sabotaging and enabling behaviors, what are they, why do we do them and what needs do these behaviors meet? Discuss unhealthy relationships and how to have positive healthy boundaries with those that sabotage and enable. Explore aspects of self-sabotage and enabling in yourself and how to limit these self-destructive behaviors in everyday life. A scaling question is used to help patient look at where they are now in their motivation to change, from 1 to 10 (lowest to highest motivation).  Therapeutic Goals: 1. Patient will identify one obstacle that relates to self-sabotage and enabling behaviors 2. Patient will identify one personal self-sabotaging or enabling behavior they did prior to admission 3. Patient able to establish a plan to change the above identified behavior they did prior to admission:  4. Patient will demonstrate ability to communicate their needs through discussion and/or role plays.   Summary of Patient Progress: The main focus of today's process group was to discuss what "self-sabotage" means and use Motivational Interviewing to discuss what benefits, negative or positive, were involved in a self-identified self-sabotaging behavior. We then talked about reasons the patient may want to change the behavior and his/her current desire to change. A scaling question was used to help patient look at where they are now in motivation for change, from 1 to 10 (lowest to highest motivation).  Patient shared during group warm up that "holidays have no affect on me but I am looking forward to getting this depression under control." Patient presented frequently with questions concerning his medication and CSW provided minimal  education with plan to provide him written information after group. Patient expressed satisfaction with arrangement yet later interrupted group process with same concerns. Patient appeared to doze off at one point.    Therapeutic Modalities:   Cognitive Behavioral Therapy Person-Centered Therapy Motivational Interviewing   Carney Bernatherine C Jeb Schloemer, LCSW

## 2014-09-10 MED ORDER — NICOTINE POLACRILEX 2 MG MT GUM
2.0000 mg | CHEWING_GUM | OROMUCOSAL | Status: DC | PRN
Start: 2014-09-10 — End: 2014-09-12

## 2014-09-10 MED ORDER — NICOTINE POLACRILEX 2 MG MT GUM
CHEWING_GUM | OROMUCOSAL | Status: AC
  Administered 2014-09-10: 17:00:00
  Filled 2014-09-10: qty 1

## 2014-09-10 NOTE — Progress Notes (Signed)
Adult Psychoeducational Group Note  Date:  09/10/2014 Time:  10:18 PM  Group Topic/Focus:  Wrap-Up Group:   The focus of this group is to help patients review their daily goal of treatment and discuss progress on daily workbooks.  Participation Level:  Active  Participation Quality:  Appropriate  Affect:  Appropriate  Cognitive:  Appropriate  Insight: Appropriate  Engagement in Group:  Engaged  Modes of Intervention:  Discussion  Additional Comments:  The patient expressed that it took time to realize that there was help for his situation.The patient also said that he now has a positive attitude  about his situation.  Octavio Mannshigpen, Brisha Mccabe Lee 09/10/2014, 10:18 PM

## 2014-09-10 NOTE — Progress Notes (Signed)
[facility]  09/10/2014 3:31 PM Terry Terry Hill  MRN:  [id number] Subjective:  Chart reviewed. Pt interviewed. Case discussed with nursing staff. Pt reports feeling "better" today. Pt denies withdrawal sxs, no prn ativan needed. Pt wears a hearing aid. Sleep/appetite good. Pt is calm/cooperative on unit.  Diagnosis:   DSM5:  Depressive Disorders:  Major Depressive Disorder - Severe (296.23) Total Time spent with patient: 20 minutes    ADL's:  Intact  Sleep: Good  Appetite:  Good  Suicidal Ideation: Pt denies.  Homicidal Ideation: Pt denies.  AEB (as evidenced by):  Psychiatric Specialty Exam: Physical Exam  ROS  Blood pressure 110/72, pulse 104, temperature 97.7 F (36.5 C), temperature source Oral, resp. rate 16, height 6\' 2"  (1.88 m), weight 84.823 kg (187 lb).Body mass index is 24 kg/(m^2).  General Appearance: Casual and Fairly Groomed  Patent attorneyye Contact::  Fair  Speech:  Normal Rate  Volume:  Normal  Mood:  Depressed  Affect:  Congruent and Depressed  Thought Process:  Goal Directed  Orientation:  Full (Time, Place, and Person)  Thought Content:  Negative  Suicidal Thoughts:  No  Homicidal Thoughts:  No  Memory:  Negative  Judgement:  Impaired  Insight:  Lacking  Psychomotor Activity:  Normal  Concentration:  Fair  Recall:  Fair  Fund of Knowledge:Fair  Language: Fair  Akathisia:  Negative  Handed:  Right  AIMS (if indicated):     Assets:  Communication Skills Desire for Improvement  Sleep:  Number of Hours: 6.5   Musculoskeletal: Strength & Muscle Tone: within normal limits Gait & Station: normal Patient leans: N/A  Current Medications: Current Facility-Administered Medications  Medication Dose Route Frequency Provider Last Rate Last Dose  . acetaminophen (TYLENOL) tablet 650 mg  650 mg Oral Q6H PRN Nanine MeansJamison Lord, NP      . alum & mag hydroxide-simeth (MAALOX/MYLANTA) 200-200-20 MG/5ML suspension 30 mL  30 mL Oral Q4H PRN Nanine MeansJamison Lord, NP       . FLUoxetine (PROZAC) capsule 20 mg  20 mg Oral Daily Nanine MeansJamison Lord, NP   20 mg at 09/10/14 0804  . irbesartan (AVAPRO) tablet 300 mg  300 mg Oral Daily Nanine MeansJamison Lord, NP   300 mg at 09/10/14 0804  . LORazepam (ATIVAN) tablet 0-4 mg  0-4 mg Oral 4 times per day Nanine MeansJamison Lord, NP   1 mg at 09/09/14 2238   Followed by  . LORazepam (ATIVAN) tablet 0-4 mg  0-4 mg Oral Q12H Nanine MeansJamison Lord, NP      . magnesium hydroxide (MILK OF MAGNESIA) suspension 30 mL  30 mL Oral Daily PRN Nanine MeansJamison Lord, NP      . traZODone (DESYREL) tablet 100 mg  100 mg Oral QHS Nanine MeansJamison Lord, NP   100 mg at 09/09/14 2238    Lab Results: No results found for this or any previous visit (from the past 48 hour(s)).  Physical Findings: AIMS: Facial and Oral Movements Muscles of Facial Expression: None, normal Lips and Perioral Area: None, normal Jaw: None, normal Tongue: None, normal,Extremity Movements Upper (arms, wrists, hands, fingers): None, normal Lower (legs, knees, ankles, toes): None, normal, Trunk Movements Neck, shoulders, hips: None, normal, Overall Severity Severity of abnormal movements (highest score from questions above): None, normal Incapacitation due to abnormal movements: None, normal Patient's awareness of abnormal movements (rate only patient's report): No Awareness, Dental Status Current problems with teeth and/or dentures?: No Does patient usually wear dentures?: No  CIWA:  CIWA-Ar Total: 5 COWS:  Treatment Plan Summary: Daily contact with patient to assess and evaluate symptoms and progress in treatment Medication management continue prozac for depression.   Plan:  Medical Decision Making Problem Points:  Established problem, stable/improving (1) and Review of last therapy session (1) Data Points:  Review and summation of old records (2) Review of medication regiment & side effects (2)  I certify that inpatient services furnished can reasonably be expected to improve the patient's  condition.   Ancil LinseySARANGA, Kagen Kunath 09/10/2014, 3:31 PM

## 2014-09-10 NOTE — [facility] Group Notes (Signed)
[facility] LCSW Group Therapy 09/10/2014 1:15pm  Type of Therapy: Group Therapy- Feelings Around Discharge & Establishing a Supportive Framework  Participation Level: Active   Modes of Intervention: Clarification, Confrontation, Discussion, Education, Exploration, Limit-setting, Orientation, Problem-solving, Rapport Building, Dance movement psychotherapisteality Testing, Socialization and Support   Description of Group:   What is a supportive framework? What does it look like feel like and how do I discern it from and unhealthy non-supportive network? Learn how to cope when supports are not helpful and don't support you. Discuss what to do when your family/friends are not supportive.  Summary of Patient Progress Pt participated in group discussion, but was engagement was somewhat reserved. Pt reported that he was not affected by the negative opinions others had of him. Pt participated in the discussion regarding characteristics of both positive and unhealthy supportive relationships.   Therapeutic Modalities:   Cognitive Behavioral Therapy Person-Centered Therapy Motivational Interviewing   Chad CordialLauren Carter, LCSWA 09/10/2014 3:01 PM

## 2014-09-10 NOTE — [facility] Group Notes (Signed)
[facility] Group Notes:  (Nursing/MHT/Case Management/Adjunct)  Date:  09/10/2014  Time:  9:45am  Type of Therapy:  Nurse Education - Healthy Support Systems  Participation Level:  Active  Participation Quality:  Appropriate  Affect:  Appropriate  Cognitive:  Appropriate  Insight:  Appropriate  Engagement in Group:  Engaged  Modes of Intervention:  Discussion, Education and Support  Summary of Progress/Problems: Assisted patient in working through [facility] [facility] Support Systems workbook especially with regards to how it applies to patient's life.  Terry Hill, Terry Hill 09/10/2014, 2:52 PM

## 2014-09-10 NOTE — Progress Notes (Signed)
Patient ID: Terry Hill, male   DOB: 01/05/1956, 58 y.o.   MRN: [id number] D. Patient presents with depressed mood, affect congruent.  Terry Hill has been cooperative with staff , he has denied any withdrawal symptoms or acute concerns, stating '' I don't need to take anything really, I'd really just like to get to go home. '' He has been visible in the milieu, attending unit programming. He denies any acute concerns.  He denies any SI/HI and is able to contract for safety. A. Medications given as ordered. Support encouragement provided. Discussed patient progress with Dr. Tawni CarnesSaranga. R. Patient is safe, in no acute distress at this time. Will continue to monitor q 15 minutes for safety.

## 2014-09-10 NOTE — [facility] Group Notes (Signed)
[facility] Group Notes:  (Nursing/MHT/Case Management/Adjunct)  Date:  09/10/2014  Time:  9:30am  Type of Therapy:  Nurse Education - Patient Self Inventory  Participation Level:  Active  Participation Quality:  Appropriate  Affect:  Appropriate  Cognitive:  Appropriate  Insight:  Appropriate  Engagement in Group:  Engaged  Modes of Intervention:  Discussion, Education and Support  Summary of Progress/Problems: Patient denies concerns and states, "the meds have taken effect. I feel ready to go."  Lawrence MarseillesFriedman, Deazia Lampi Eakes 09/10/2014, 2:51 PM
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
## 2014-09-10 NOTE — Progress Notes (Signed)
D Pt. Denies SI and HI, no complaints of pain or discomfort noted.    A Writer provides support and encouragement, discussed discharge plans with pt.  R Pt,. Remains safe on the unit.  Pt. Reports no anxiety or depression at this time. Pt. Reports he does not need the ativan or trazodone this pm.  No tremors noted on assessment.  Pt. States he will return to his home and to work.

## 2014-09-11 DIAGNOSIS — F1099 Alcohol use, unspecified with unspecified alcohol-induced disorder: Secondary | ICD-10-CM

## 2014-09-11 DIAGNOSIS — F1490 Cocaine use, unspecified, uncomplicated: Secondary | ICD-10-CM

## 2014-09-11 DIAGNOSIS — F322 Major depressive disorder, single episode, severe without psychotic features: Principal | ICD-10-CM

## 2014-09-11 LAB — TSH: TSH: 2.36 u[IU]/mL (ref 0.350–4.500)

## 2014-09-11 MED ORDER — FLUOXETINE HCL 20 MG PO CAPS
40.0000 mg | ORAL_CAPSULE | Freq: Every day | ORAL | Status: DC
  Administered 2014-09-12: 40 mg via ORAL
  Filled 2014-09-11: qty 4
  Filled 2014-09-11 (×2): qty 2

## 2014-09-11 NOTE — Progress Notes (Signed)
Patient ID: TAMIR WALLMAN, male   DOB: 08/12/1956, 58 y.o.   MRN: [id number] D: Patient alert and cooperative in dayroom on approach. Pt mood/affect is appropriate to situation. Pt excited about possible discharge tomorrow. Pt stated he will follow up by going to [employer]. Pt reports his employer does random drug test so he needs to stay clean. Pt denies any withdrawal symptoms. Pt denies SI/HI/AVH and pain. Pt attended evening wrap up group and engage in discussion.  Cooperative with assessment.   A: Met with pt 1:1. Medications administered as prescribed. Writer encouraged pt to discuss feelings. Pt encouraged to come to staff with any question or concerns.   R: Patient is safe and complaint with medications.

## 2014-09-11 NOTE — [facility] Group Notes (Signed)
[facility] LCSW Group Therapy  09/11/2014 1:58 PM  Type of Therapy:  Group Therapy  Participation Level:  Active  Participation Quality:  Attentive  Affect:  Appropriate  Cognitive:  Alert and Oriented  Insight:  Engaged  Engagement in Therapy:  Engaged  Modes of Intervention:  Confrontation, Discussion, Education, Exploration, Problem-solving, Rapport Building, Socialization and Support  Summary of Progress/Problems:  Today's Topic: Community. Group members were asked to identify what "community" means to them, what values a community shares, and examples of where they felt a sense of community. Group members were challenged to process how a community functions to support its members and identify how the positive and negative aspects of a community impact the individual. Terry Hill was attentive and engaged during today's processing group. He shared that to him, community means "caring." He identified "the neighborhood" as a place where he feels a sense of community. "Everyone watches out for everyone else." Randa SpikeForrest shows progress in the group setting and improving insight AEB his ability to process how community members and possess good and bad characteristics and the overall community can help that person overcome obstacles. He identified empathy as an important concept "considering other people's feelings is necessary in the community."   Smart, Lebron QuamHeather LCSWA  09/11/2014, 1:58 PM
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
## 2014-09-11 NOTE — Progress Notes (Signed)
D: Patient is alert and oriented. Pt's mood and affect is depressed and flat. Pt reports "I didn't choose to come here." Pt is minimally engaged with RN. Pt denies SI/HI and AVH at this time. Pt is attending groups. Pts mood and affect progressed throughout the day and became assertive and pleasant, with appropriate affect. A: Scheduled medications administered per providers orders (See MAR). 15 minute checks completed per protocol for pt safety. R: Pt cooperative and receptive to nursing interventions.

## 2014-09-11 NOTE — Tx Team (Signed)
  Interdisciplinary Treatment Plan Update   Date Reviewed:  09/11/2014  Time Reviewed:  8:31 AM  Progress in Treatment:   Attending groups: Yes Participating in groups: Yes Taking medication as prescribed: Yes  Tolerating medication: Yes Family/Significant other contact made: Yes  Patient understands diagnosis: Yes  Discussing patient identified problems/goals with staff: Yes  See initial care plan Medical problems stabilized or resolved: Yes Denies suicidal/homicidal ideation: Yes  In tx team Patient has not harmed self or others: Yes  For review of initial/current patient goals, please see plan of care.  Estimated Length of Stay:  3-4 days  Reason for Continuation of Hospitalization: Depression Medication stabilization Withdrawal symptoms  New Problems/Goals identified:  N/A  Discharge Plan or Barriers:   return home, follow up outpt  Additional Comments:  Patient is a 58 year old African-American male with a diagnosis of MDD, Alcohol Use Disorder, and cocaine abuse. Pt lives at home alone and reports that he recently has been dealing with several stressors. He and his wife separated 3-4 months ago; wife took the children and [place]. Pt and wife have 6 adopted children. Pt reports that his job has been stressful as well. Pt was raised by his sister as both parents passed away between the ages of 89-15. Pt is not currently established with an outpatient provider but would like a referral. Pt has private insurance. He is not interested in inpatient or outpatient substance abuse treatment but reports that he wants to be back involved with AA/NA.  Ativan detox protocol  Prozac for depression.  Pt is responding positively to treatment and the therapetuic milieu.  Attendees:  Signature: Ivin BootySarama Eappen, MD 09/11/2014 8:31 AM   Signature: Richelle Itood Altariq Goodall, LCSW 09/11/2014 8:31 AM  Signature:  09/11/2014 8:31 AM  Signature:  09/11/2014 8:31 AM  Signature: Marzetta Boardhrista Dopson, RN 09/11/2014  8:31 AM  Signature:  09/11/2014 8:31 AM  Signature:   09/11/2014 8:31 AM  Signature:    Signature:    Signature:    Signature:    Signature:    Signature:      Scribe for Treatment Team:   Richelle Itood Jenniffer Vessels, LCSW  09/11/2014 8:31 AM
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
## 2014-09-11 NOTE — Plan of Care (Signed)
Problem: Ineffective individual coping Goal: STG: Patient will remain free from self harm Outcome: Progressing Patient remains free from self harm. Pt denies suicidal ideation today. 15 minute checks completed per protocol for pt safety.  Problem: Alteration in mood Goal: LTG-Patient reports reduction in suicidal thoughts (Patient reports reduction in suicidal thoughts and is able to verbalize a safety plan for whenever patient is feeling suicidal)  Outcome: Completed/Met Date Met:  09/11/14 Patient denies Suicidal thoughts.

## 2014-09-12 DIAGNOSIS — F1020 Alcohol dependence, uncomplicated: Secondary | ICD-10-CM | POA: Insufficient documentation

## 2014-09-12 DIAGNOSIS — F1420 Cocaine dependence, uncomplicated: Secondary | ICD-10-CM | POA: Insufficient documentation

## 2014-09-12 DIAGNOSIS — F322 Major depressive disorder, single episode, severe without psychotic features: Secondary | ICD-10-CM | POA: Insufficient documentation

## 2014-09-12 MED ORDER — TRAZODONE HCL 100 MG PO TABS: 100.0000 mg | ORAL_TABLET | Freq: Every day | ORAL | Status: DC

## 2014-09-12 MED ORDER — TADALAFIL 5 MG PO TABS: 5.0000 mg | ORAL_TABLET | Freq: Every day | ORAL | Status: DC

## 2014-09-12 MED ORDER — OLMESARTAN MEDOXOMIL-HCTZ 40-25 MG PO TABS: 1.0000 | ORAL_TABLET | Freq: Every day | ORAL | Status: DC

## 2014-09-12 MED ORDER — AMLODIPINE BESYLATE 10 MG PO TABS: 10.0000 mg | ORAL_TABLET | Freq: Every day | ORAL | Status: DC

## 2014-09-12 MED ORDER — FLUOXETINE HCL 40 MG PO CAPS: 40.0000 mg | ORAL_CAPSULE | Freq: Every day | ORAL | Status: DC

## 2014-09-12 NOTE — Progress Notes (Signed)
Patient ID: Terry Hill, male   DOB: 12/06/1955, 58 y.o.   MRN: [id number]  Pt. Denies SI/HI and A/V hallucinations. Belongings returned to patient at time of discharge. Patient denies any pain or discomfort. Discharge instructions and medications were reviewed with patient. Patient verbalized understanding of both medications and discharge instructions. Patient was picked up by daughter. No distress noted upon discharge. Q15 minute safety checks maintained until discharge.

## 2014-09-12 NOTE — Discharge Summary (Signed)
Physician Discharge Summary Note  Patient:  Terry Hill is an 58 y.o., male MRN:  [id number] DOB:  05/17/1956 Patient phone:  [phone] (home)  Patient address:   [address] [place] [id number],  Total Time spent with patient: 30 minutes  Date of Admission:  09/08/2014 Date of Discharge: 09/12/14  Reason for Admission:  Depression, Substance abuse  Discharge Diagnoses: Principal Problem:   Severe major depression without psychotic features Active Problems:   MDD (major depressive disorder), single episode, severe , no psychosis   Cocaine use disorder, severe, dependence   Alcohol use disorder, severe, dependence  Psychiatric Specialty Exam: Physical Exam  Psychiatric: He has a normal mood and affect. His speech is normal and behavior is normal. Judgment and thought content normal. Cognition and memory are normal.    Review of Systems  Constitutional: Negative.   HENT: Negative.   Eyes: Negative.   Respiratory: Negative.   Cardiovascular: Negative.   Gastrointestinal: Negative.   Genitourinary: Negative.   Musculoskeletal: Negative.   Skin: Negative.   Neurological: Negative.   Endo/Heme/Allergies: Negative.   Psychiatric/Behavioral: Positive for depression (Stabilized with treatment) and substance abuse (Currently stabilized ). Negative for suicidal ideas.    Blood pressure 136/93, pulse 71, temperature 97.7 F (36.5 C), temperature source Oral, resp. rate 16, height 6\' 2"  (1.88 m), weight 84.823 kg (187 lb).Body mass index is 24 kg/(m^2).   Musculoskeletal: Strength & Muscle Tone: within normal limits Gait & Station: normal Patient leans: N/A  DSM5:  Axis Diagnosis:   Primary Psychiatric Diagnosis: Major depressive disorder,single episode severe without psychosis(ACUTE PHASE RESOLVED)  Secondary Psychiatric Diagnosis: Alcohol use disorder,severe Stimulant (cocaine) use disorder,severe  Non Psychiatric Diagnosis: See pmh  Level of  Care:  OP  Hospital Course:  Terry Hill is a 58 year old African American Male who endorses a long history of dysthymia. States "I have always been depressed" Reports prior to admission he had a bad day and had thoughts to kill himself. Recent stressors include wife and family moving to [place] several months ago with patient's depression worsening over this time. Pt felt "everything was closing in on me". Terry Hill reports that he was ready to "end it all and put a bullet in my head" Patient had a loaded shot gun in bed with him and was thinking about pulling the trigger but called his boss at work who contacted the sheriff. Police broke down his door, and found him with the loaded shotgun on his bed. He has been drinking alcohol to self medicate. Reports he drinks a fifth of liquor daily to every other day.Denies current withdrawal symptoms. Endorses some cocaine use recently (to alleviate his pain). Patient endorses many symptoms of depression including anhedonia, worthlessness, guilt. Denies Auditory or Visual hallucinations or delusions.          Terry Hill was admitted to the adult unit. He was evaluated and his symptoms were identified. Medication management was discussed and initiated. The patient was not taking any psychiatric medication prior admission. The patient was started on Prozac daily for depression. He was started on Trazodone 100 mg hs for insomnia. The patient was placed on an ativan protocol for forty eight hours due to his alcohol use. He was oriented to the unit and encouraged to participate in unit programming. Medical problems were identified and treated appropriately. His blood pressure medication was continued from home.          The patient was evaluated each day  by a clinical provider to ascertain the patient's response to treatment.  Improvement was noted by the patient's report of decreasing symptoms, improved sleep and appetite, affect, medication tolerance,  behavior, and participation in unit programming. His Prozac was increased to 40 mg daily by time of discharge to address his symptoms of depression. He was asked each day to complete a self inventory noting mood, mental status, pain, new symptoms, anxiety and concerns.         He responded well to medication and being in a therapeutic and supportive environment. Positive and appropriate behavior was noted and the patient was motivated for recovery.  The patient worked closely with the treatment team and case manager to develop a discharge plan with appropriate goals. He talked about being under stress due to financial stressors and from his family relocating to [place]. The patient denied making a suicide attempt and reported the gun belonged to his wife. Coping skills, problem solving as well as relaxation therapies were also part of the unit programming.         By the day of discharge he was in much improved condition than upon admission.  Symptoms were reported as significantly decreased or resolved completely.  The patient denied SI/HI and voiced no AVH. He was motivated to continue taking medication with a goal of continued improvement in mental health. Terry Hill was discharged home with a plan to follow up as noted below. Patient was provided with prescriptions and sample medication at time of discharge. He planned on going back to AA meetings to deal with his alcohol abuse after discharge. He left [facility] in stable condition with all belongings returned to him.   Consults:  psychiatry  Significant Diagnostic Studies:   Chemistry panel, CBC, TSH, UDS positive for cocaine  Discharge Vitals:   Blood pressure 136/93, pulse 71, temperature 97.7 F (36.5 C), temperature source Oral, resp. rate 16, height 6\' 2"  (1.88 m), weight 84.823 kg (187 lb). Body mass index is 24 kg/(m^2). Lab Results:   Results for orders placed or performed during the hospital encounter of 09/08/14 (from the past 72  hour(s))  TSH     Status: None   Collection Time: 09/11/14  7:45 PM  Result Value Ref Range   TSH 2.360 0.350 - 4.500 uIU/mL    Comment: Performed at [facility]    Physical Findings: AIMS: Facial and Oral Movements Muscles of Facial Expression: None, normal Lips and Perioral Area: None, normal Jaw: None, normal Tongue: None, normal,Extremity Movements Upper (arms, wrists, hands, fingers): None, normal Lower (legs, knees, ankles, toes): None, normal, Trunk Movements Neck, shoulders, hips: None, normal, Overall Severity Severity of abnormal movements (highest score from questions above): None, normal Incapacitation due to abnormal movements: None, normal Patient's awareness of abnormal movements (rate only patient's report): No Awareness, Dental Status Current problems with teeth and/or dentures?: No Does patient usually wear dentures?: No  CIWA:  CIWA-Ar Total: 0 COWS:  COWS Total Score: 0  Psychiatric Specialty Exam: See Psychiatric Specialty Exam and Suicide Risk Assessment completed by Attending Physician prior to discharge.  Discharge destination:  Home  Is patient on multiple antipsychotic therapies at discharge:  No   Has Patient had three or more failed trials of antipsychotic monotherapy by history:  No  Recommended Plan for Multiple Antipsychotic Therapies: NA     Medication List    TAKE these medications      Indication   amLODipine 10 MG tablet  Commonly known  as:  NORVASC  Take 1 tablet (10 mg total) by mouth daily with breakfast.   Indication:  High Blood Pressure     FLUoxetine 40 MG capsule  Commonly known as:  PROZAC  Take 1 capsule (40 mg total) by mouth daily.   Indication:  Major Depressive Disorder     olmesartan-hydrochlorothiazide 40-25 MG per tablet  Commonly known as:  BENICAR HCT  Take 1 tablet by mouth daily with breakfast.      tadalafil 5 MG tablet  Commonly known as:  CIALIS  Take 1 tablet (5 mg total) by mouth daily with  breakfast.   Indication:  Erectile Dysfunction     traZODone 100 MG tablet  Commonly known as:  DESYREL  Take 1 tablet (100 mg total) by mouth at bedtime.   Indication:  Alcohol Withdrawal Syndrome, Cocaine Withdrawal, Trouble Sleeping         Follow-up recommendations:   Activity: no restrictions Diet: regular  Comments:   Take all your medications as prescribed by your mental healthcare provider.  Report any adverse effects and or reactions from your medicines to your outpatient provider promptly.  Patient is instructed and cautioned to not engage in alcohol and or illegal drug use while on prescription medicines.  In the event of worsening symptoms, patient is instructed to call the crisis hotline, 911 and or go to the nearest ED for appropriate evaluation and treatment of symptoms.  Follow-up with your primary care provider for your other medical issues, concerns and or health care needs.   Total Discharge Time:  Greater than 30 minutes.  SignedFransisca Kaufmann: Biana Haggar NP-C 09/12/2014, 10:19 AM

## 2014-09-12 NOTE — [facility] Suicide Risk Assessment (Signed)
   Demographic Factors:  Male, Low socioeconomic status and Living alone  Total Time spent with patient: 45 minutes  Psychiatric Specialty Exam: Physical Exam  ROS  Blood pressure 136/93, pulse 71, temperature 97.7 F (36.5 C), temperature source Oral, resp. rate 16, height 6\' 2"  (1.88 m), weight 84.823 kg (187 lb).Body mass index is 24 kg/(m^2).  General Appearance: Casual  Eye Contact::  Good  Speech:  Clear and Coherent  Volume:  Normal  Mood:  Euthymic  Affect:  Congruent  Thought Process:  Coherent  Orientation:  Full (Time, Place, and Person)  Thought Content:  WDL  Suicidal Thoughts:  No  Homicidal Thoughts:  No  Memory:  Immediate;   Fair Recent;   Fair Remote;   Fair  Judgement:  Fair  Insight:  Fair  Psychomotor Activity:  Normal  Concentration:  Fair  Recall:  FiservFair  Fund of Knowledge:Good  Language: Good  Akathisia:  No  Handed:  Right  AIMS (if indicated):     Assets:  Communication Skills Desire for Improvement Housing Intimacy Physical Health Resilience Social Support Talents/Skills Vocational/Educational  Sleep:  Number of Hours: 6.25    Musculoskeletal: Strength & Muscle Tone: within normal limits Gait & Station: normal Patient leans: N/A   Mental Status Per Nursing Assessment::   On Admission:     Current Mental Status by Physician: Patient currently denies SI/HI/AH/VH. Patient reports having no intentions to kill self ,reports being intoxicated on drugs when he made the comment "that he wants to die" to his friend. Pt currently appears to be pleasant ,rates depression at a 2 ,reports medications as working ,sleep is good and denies any SI . Pt has no access to firearms at this time .  Loss Factors: Financial problems/change in socioeconomic status  Historical Factors: Impulsivity  Risk Reduction Factors:   Sense of responsibility to family, Religious beliefs about death, Employed, Positive social support and Positive coping skills or  problem solving skills  Continued Clinical Symptoms:  Alcohol/Substance Abuse/Dependencies Unstable or Poor Therapeutic Relationship  Cognitive Features That Contribute To Risk:  Patient is alert,oriented x3    Suicide Risk:  Minimal: No identifiable suicidal ideation.    Discharge Diagnoses:  DSM5: Primary Psychiatric Diagnosis: Major depressive disorder,single episode severe without psychosis(ACUTE PHASE RESOLVED)   Secondary Psychiatric Diagnosis: Alcohol use disorder,severe Stimulant (cocaine) use disorder,severe   Non Psychiatric Diagnosis: See pmh   Past Medical History  Diagnosis Date  . Hypertension     Plan Of Care/Follow-up recommendations:  Activity:  no restrictions Diet:  regular  Is patient on multiple antipsychotic therapies at discharge:  No   Has Patient had three or more failed trials of antipsychotic monotherapy by history:  No  Recommended Plan for Multiple Antipsychotic Therapies: NA    Serrita Lueth MD 09/12/2014, 9:42 AM

## 2014-09-12 NOTE — Progress Notes (Signed)
[facility] Discharge Plan :  Will you be returning to the same living situation after discharge: Yes,  home At discharge, do you have transportation home?:Yes,  family Do you have the ability to pay for your medications:Yes,  insurance  Release of information consent forms completed and in the chart;  Patient's signature needed at discharge.  Patient to Follow up at: Follow-up Information    Follow up with [facility] On 10/13/2014.   Why:  Friday at 1:15 PM  Arrive half an hour early to complete paperwork.   Contact information:   [address]  [address]  [place] [phone]      Follow up with Follow up with AA mtgs and getting a sponser.      Patient denies SI/HI:   Yes,  yes    Safety Planning and Suicide Prevention discussed:  Yes,  yes  Terry Hill, Terry Hill, 4:03 PM

## 2014-09-12 NOTE — Plan of Care (Signed)
Problem: Diagnosis: Increased Risk For Suicide Attempt Goal: STG-Patient Will Attend All Groups On The Unit Outcome: Completed/Met Date Met:  09/12/14 Pt reports attending all group and engaging in the milieu.

## 2014-09-12 NOTE — [facility] Group Notes (Signed)
[facility] Group Notes:  (Nursing/MHT/Case Management/Adjunct)  Date:  09/12/2014  Time:  12:22 PM  Type of Therapy:  Nurse Education  Participation Level:  Minimal  Participation Quality:  Appropriate and Attentive  Affect:  Appropriate  Cognitive:  Alert  Insight:  Lacking  Engagement in Group:  Engaged  Modes of Intervention:  Discussion and Education  Summary of Progress/Problems: Patient attended group. Patient did not speak but would intermittently nod her head. The group topic was recovery. Staff discussed with patient's about what recovery means and coping skills to continue on.  Marzetta BoardDopson, Linsie Lupo E 09/12/2014, 12:22 PM

## 2014-09-12 NOTE — [facility] Suicide Risk Assessment (Signed)
[facility] INPATIENT:  Family/Significant Other Suicide Prevention Education  Suicide Prevention Education:  Patient Refusal for Family/Significant Other Suicide Prevention Education: The patient Terry Hill has refused to provide written consent for family/significant other to be provided Family/Significant Other Suicide Prevention Education during admission and/or prior to discharge.  Physician notified.  "I'd have you call someone, but all my numbers are in my phone.  I will call you on my way home to give you a name and number."  [name], Rhodie Cienfuegos B 09/12/2014, 4:05 PM

## 2014-09-12 NOTE — Tx Team (Signed)
  Interdisciplinary Treatment Plan Update   Date Reviewed:  09/12/2014  Time Reviewed:  10:02 AM  Progress in Treatment:   Attending groups: Yes Participating in groups: Yes Taking medication as prescribed: Yes  Tolerating medication: Yes Family/Significant other contact made: Yes  Patient understands diagnosis: Yes  Discussing patient identified problems/goals with staff: Yes  See initial care plan Medical problems stabilized or resolved: Yes Denies suicidal/homicidal ideation: Yes  In tx team Patient has not harmed self or others: Yes  For review of initial/current patient goals, please see plan of care.  Estimated Length of Stay:  D/C today  Reason for Continuation of Hospitalization:   New Problems/Goals identified:  N/A  Discharge Plan or Barriers:   return home, follow up outpt  Additional Comments:  Attendees:  Signature: Ivin BootySarama Eappen, MD 09/12/2014 10:02 AM   Signature: Richelle Itood Mindy Gali, LCSW 09/12/2014 10:02 AM  Signature: [name], NP 09/12/2014 10:02 AM  Signature: Lendell CapriceBrittany Guthrie, RN 09/12/2014 10:02 AM  Signature:  09/12/2014 10:02 AM  Signature:  09/12/2014 10:02 AM  Signature:   09/12/2014 10:02 AM  Signature:    Signature:    Signature:    Signature:    Signature:    Signature:      Scribe for Treatment Team:   Richelle Itood Shefali Ng, LCSW  09/12/2014 10:02 AM

## 2014-09-15 NOTE — Progress Notes (Signed)
Patient Discharge Instructions:  After Visit Summary (AVS):   Faxed to:  09/15/14 Discharge Summary Note:   Faxed to:  09/15/14 Psychiatric Admission Assessment Note:   Faxed to:  09/15/14 Suicide Risk Assessment - Discharge Assessment:   Faxed to:  09/15/14 Faxed/Sent to the Next Level Care provider:  09/15/14 Faxed to [facility] @ [phone]  Jerelene ReddenSheena E [facility], 09/15/2014, 3:58 PM

## 2016-05-16 ENCOUNTER — Encounter (HOSPITAL_COMMUNITY): Payer: Self-pay

## 2016-05-16 ENCOUNTER — Encounter (HOSPITAL_COMMUNITY): Payer: Self-pay | Admitting: Emergency Medicine

## 2016-05-16 ENCOUNTER — Inpatient Hospital Stay (HOSPITAL_COMMUNITY)
Admission: AD | Admit: 2016-05-16 | Discharge: 2016-05-22 | DRG: 897 | Disposition: A | Discharge status: Home / Self Care | Payer: 59 | Source: Intra-hospital | Attending: Emergency Medicine | Admitting: Emergency Medicine

## 2016-05-16 ENCOUNTER — Emergency Department (HOSPITAL_COMMUNITY)
Admission: EM | Admit: 2016-05-16 | Discharge: 2016-05-16 | Disposition: A | Discharge status: Other Acute Inpatient Hospital | Payer: 59 | Attending: Emergency Medicine | Admitting: Emergency Medicine

## 2016-05-16 DIAGNOSIS — F322 Major depressive disorder, single episode, severe without psychotic features: Secondary | ICD-10-CM | POA: Diagnosis present

## 2016-05-16 DIAGNOSIS — R0789 Other chest pain: Secondary | ICD-10-CM

## 2016-05-16 DIAGNOSIS — I10 Essential (primary) hypertension: Secondary | ICD-10-CM | POA: Diagnosis present

## 2016-05-16 DIAGNOSIS — Y901 Blood alcohol level of 20-39 mg/100 ml: Secondary | ICD-10-CM | POA: Diagnosis present

## 2016-05-16 DIAGNOSIS — G4700 Insomnia, unspecified: Secondary | ICD-10-CM | POA: Diagnosis present

## 2016-05-16 DIAGNOSIS — F17210 Nicotine dependence, cigarettes, uncomplicated: Secondary | ICD-10-CM | POA: Diagnosis present

## 2016-05-16 DIAGNOSIS — R45851 Suicidal ideations: Secondary | ICD-10-CM | POA: Diagnosis present

## 2016-05-16 DIAGNOSIS — F1020 Alcohol dependence, uncomplicated: Secondary | ICD-10-CM | POA: Diagnosis present

## 2016-05-16 DIAGNOSIS — F1420 Cocaine dependence, uncomplicated: Secondary | ICD-10-CM | POA: Diagnosis present

## 2016-05-16 DIAGNOSIS — Z79899 Other long term (current) drug therapy: Secondary | ICD-10-CM | POA: Diagnosis not present

## 2016-05-16 DIAGNOSIS — F10230 Alcohol dependence with withdrawal, uncomplicated: Secondary | ICD-10-CM | POA: Diagnosis not present

## 2016-05-16 DIAGNOSIS — F329 Major depressive disorder, single episode, unspecified: Secondary | ICD-10-CM | POA: Insufficient documentation

## 2016-05-16 HISTORY — DX: Suicidal ideations: R45.851

## 2016-05-16 HISTORY — DX: Gout, unspecified: M10.9

## 2016-05-16 HISTORY — DX: Depression, unspecified: F32.A

## 2016-05-16 HISTORY — DX: Major depressive disorder, single episode, unspecified: F32.9

## 2016-05-16 LAB — SALICYLATE LEVEL: Salicylate Lvl: <4 mg/dL (ref 2.8–30.0)

## 2016-05-16 LAB — COMPREHENSIVE METABOLIC PANEL
ALBUMIN: 4.5 g/dL (ref 3.5–5.0)
ALT: 28 U/L (ref 17–63)
ANION GAP: 16 — AB (ref 5–15)
AST: 28 U/L (ref 15–41)
Alkaline Phosphatase: 67 U/L (ref 38–126)
BILIRUBIN TOTAL: 0.5 mg/dL (ref 0.3–1.2)
BUN: 14 mg/dL (ref 6–20)
CO2: 21 mmol/L — AB (ref 22–32)
Calcium: 9.5 mg/dL (ref 8.9–10.3)
Chloride: 101 mmol/L (ref 101–111)
Creatinine, Ser: 1.18 mg/dL (ref 0.61–1.24)
GFR calc Af Amer: >60 mL/min (ref >60)
GFR calc non Af Amer: >60 mL/min (ref >60)
GLUCOSE: 111 mg/dL — AB (ref 65–99)
POTASSIUM: 3.6 mmol/L (ref 3.5–5.1)
SODIUM: 138 mmol/L (ref 135–145)
TOTAL PROTEIN: 8.1 g/dL (ref 6.5–8.1)

## 2016-05-16 LAB — RAPID URINE DRUG SCREEN, HOSP PERFORMED
AMPHETAMINES: NOT DETECTED
BENZODIAZEPINES: NOT DETECTED
Barbiturates: NOT DETECTED
Cocaine: POSITIVE — AB
Opiates: NOT DETECTED
TETRAHYDROCANNABINOL: NOT DETECTED

## 2016-05-16 LAB — CBC
HCT: 42.5 % (ref 39.0–52.0)
HEMOGLOBIN: 14.4 g/dL (ref 13.0–17.0)
MCH: 30.2 pg (ref 26.0–34.0)
MCHC: 33.9 g/dL (ref 30.0–36.0)
MCV: 89.1 fL (ref 78.0–100.0)
PLATELETS: 239 10*3/uL (ref 150–400)
RBC: 4.77 MIL/uL (ref 4.22–5.81)
RDW: 13.8 % (ref 11.5–15.5)
WBC: 10.3 10*3/uL (ref 4.0–10.5)

## 2016-05-16 LAB — ETHANOL: Alcohol, Ethyl (B): 37 mg/dL — ABNORMAL HIGH (ref <5)

## 2016-05-16 LAB — ACETAMINOPHEN LEVEL: Acetaminophen (Tylenol), Serum: <10 ug/mL — ABNORMAL LOW (ref 10–30)

## 2016-05-16 MED ORDER — ONDANSETRON 4 MG PO TBDP: 4.0000 mg | ORAL_TABLET | Freq: Four times a day (QID) | ORAL | Status: AC | PRN

## 2016-05-16 MED ORDER — HYDROXYZINE HCL 25 MG PO TABS: 25.0000 mg | ORAL_TABLET | Freq: Four times a day (QID) | ORAL | Status: AC | PRN

## 2016-05-16 MED ORDER — TRAZODONE HCL 100 MG PO TABS
100.0000 mg | ORAL_TABLET | Freq: Every evening | ORAL | Status: DC | PRN
  Administered 2016-05-17 – 2016-05-19 (×2): 100 mg via ORAL
  Filled 2016-05-16 (×3): qty 1

## 2016-05-16 MED ORDER — PNEUMOCOCCAL VAC POLYVALENT 25 MCG/0.5ML IJ INJ
0.5000 mL | INJECTION | INTRAMUSCULAR | Status: AC
  Administered 2016-05-22: 0.5 mL via INTRAMUSCULAR

## 2016-05-16 MED ORDER — CHLORDIAZEPOXIDE HCL 25 MG PO CAPS
25.0000 mg | ORAL_CAPSULE | ORAL | Status: AC
Start: 2016-05-18 — End: 2016-05-18
  Administered 2016-05-18 (×2): 25 mg via ORAL
  Filled 2016-05-16 (×3): qty 1

## 2016-05-16 MED ORDER — IRBESARTAN 300 MG PO TABS
300.0000 mg | ORAL_TABLET | Freq: Every day | ORAL | Status: DC
  Administered 2016-05-17 – 2016-05-22 (×6): 300 mg via ORAL
  Filled 2016-05-16 (×9): qty 1

## 2016-05-16 MED ORDER — LORAZEPAM 1 MG PO TABS: 0.0000 mg | ORAL_TABLET | Freq: Two times a day (BID) | ORAL | Status: DC

## 2016-05-16 MED ORDER — ADULT MULTIVITAMIN W/MINERALS CH
1.0000 | ORAL_TABLET | Freq: Every day | ORAL | Status: DC
  Administered 2016-05-16 – 2016-05-22 (×7): 1 via ORAL
  Filled 2016-05-16 (×10): qty 1

## 2016-05-16 MED ORDER — MAGNESIUM HYDROXIDE 400 MG/5ML PO SUSP: 30.0000 mL | Freq: Every day | ORAL | Status: DC | PRN

## 2016-05-16 MED ORDER — VITAMIN B-1 100 MG PO TABS
100.0000 mg | ORAL_TABLET | Freq: Every day | ORAL | Status: DC
  Administered 2016-05-16: 100 mg via ORAL
  Filled 2016-05-16: qty 1

## 2016-05-16 MED ORDER — NICOTINE POLACRILEX 2 MG MT GUM
2.0000 mg | CHEWING_GUM | OROMUCOSAL | Status: DC | PRN
  Administered 2016-05-17 (×3): 2 mg via ORAL
  Filled 2016-05-16: qty 1

## 2016-05-16 MED ORDER — HYDROCHLOROTHIAZIDE 25 MG PO TABS
25.0000 mg | ORAL_TABLET | Freq: Every day | ORAL | Status: DC
  Administered 2016-05-17 – 2016-05-22 (×6): 25 mg via ORAL
  Filled 2016-05-16 (×9): qty 1

## 2016-05-16 MED ORDER — CHLORDIAZEPOXIDE HCL 25 MG PO CAPS
25.0000 mg | ORAL_CAPSULE | Freq: Every day | ORAL | Status: AC
  Administered 2016-05-19: 25 mg via ORAL

## 2016-05-16 MED ORDER — THIAMINE HCL 100 MG/ML IJ SOLN: 100.0000 mg | Freq: Every day | INTRAMUSCULAR | Status: DC

## 2016-05-16 MED ORDER — VITAMIN B-1 100 MG PO TABS
100.0000 mg | ORAL_TABLET | Freq: Every day | ORAL | Status: DC
  Administered 2016-05-17 – 2016-05-22 (×6): 100 mg via ORAL
  Filled 2016-05-16 (×9): qty 1

## 2016-05-16 MED ORDER — AMLODIPINE BESYLATE 2.5 MG PO TABS
2.5000 mg | ORAL_TABLET | Freq: Every day | ORAL | Status: DC
  Administered 2016-05-17 – 2016-05-22 (×6): 2.5 mg via ORAL
  Filled 2016-05-16 (×9): qty 1

## 2016-05-16 MED ORDER — ALUM & MAG HYDROXIDE-SIMETH 200-200-20 MG/5ML PO SUSP: 30.0000 mL | ORAL | Status: DC | PRN

## 2016-05-16 MED ORDER — CHLORDIAZEPOXIDE HCL 25 MG PO CAPS
25.0000 mg | ORAL_CAPSULE | Freq: Three times a day (TID) | ORAL | Status: AC
Start: 2016-05-17 — End: 2016-05-17
  Administered 2016-05-17 (×3): 25 mg via ORAL
  Filled 2016-05-16 (×3): qty 1

## 2016-05-16 MED ORDER — HYDROCORTISONE 1 % EX CREA
1.0000 "application " | TOPICAL_CREAM | CUTANEOUS | Status: DC | PRN
  Administered 2016-05-16 – 2016-05-17 (×3): 1 via TOPICAL
  Filled 2016-05-16: qty 28

## 2016-05-16 MED ORDER — LORAZEPAM 1 MG PO TABS: 0.0000 mg | ORAL_TABLET | Freq: Four times a day (QID) | ORAL | Status: DC

## 2016-05-16 MED ORDER — LOPERAMIDE HCL 2 MG PO CAPS: 2.0000 mg | ORAL_CAPSULE | ORAL | Status: AC | PRN

## 2016-05-16 MED ORDER — CHLORDIAZEPOXIDE HCL 25 MG PO CAPS
25.0000 mg | ORAL_CAPSULE | Freq: Four times a day (QID) | ORAL | Status: AC | PRN
  Administered 2016-05-17 – 2016-05-19 (×2): 25 mg via ORAL
  Filled 2016-05-16 (×2): qty 1

## 2016-05-16 MED ORDER — OLMESARTAN MEDOXOMIL-HCTZ 40-25 MG PO TABS: 1.0000 | ORAL_TABLET | Freq: Every day | ORAL | Status: DC

## 2016-05-16 MED ORDER — CHLORDIAZEPOXIDE HCL 25 MG PO CAPS
25.0000 mg | ORAL_CAPSULE | Freq: Four times a day (QID) | ORAL | Status: AC
Start: 2016-05-16 — End: 2016-05-16
  Administered 2016-05-16 (×2): 25 mg via ORAL
  Filled 2016-05-16 (×2): qty 1

## 2016-05-16 MED ORDER — ACETAMINOPHEN 325 MG PO TABS: 650.0000 mg | ORAL_TABLET | Freq: Four times a day (QID) | ORAL | Status: DC | PRN

## 2016-05-16 NOTE — BH Assessment (Signed)
TTS assessment completed. 

## 2016-05-16 NOTE — Progress Notes (Addendum)
Terry Hill is a 60 year old male being admitted voluntarily to [facility].  "I came in to the hospital because I slipped up using cocaine."  He reported suicidal thoughts on admission with no attempt.  "I only having suicidal thoughts when I have been drinking heavily."   He states that he uses at least 4 shots per day of liquor(rum).  He has no history of DT's or seizures.  He relapsed on cocaine two times prior to coming in.  He reports that his stressors are his wife screaming and arguing with the kids, "It's foolishness."  He reported history of hypertension.  He denies any OP psychiatric services but has [facility].  He is diagnosed with Major Depressive Disorder and Alcohol abuse.  He requested that he has no visitors "until I decide."  Visitation form completed with notation "no visits from family."  Oriented him to the unit, admission paperwork completed and signed.  Belongings searched and secured in locker # 42.  Skin assessment completed and noted red raised rash on chest and neck.  Q 15 minute checks initiated for safety.  We will monitor the progress towards his goals.

## 2016-05-16 NOTE — Progress Notes (Signed)
Patient didn't attend AA group this evening.

## 2016-05-16 NOTE — ED Notes (Signed)
TTS at patient bedside. [facility] to let them know with no answer. Will attempt to call again.

## 2016-05-16 NOTE — ED Notes (Signed)
Sitter at bedside.

## 2016-05-16 NOTE — ED Notes (Signed)
[facility] and given to Best BuyHeather RN

## 2016-05-16 NOTE — BH Assessment (Addendum)
Tele Assessment Note   Terry Hill is a 60 year old African American male that reports SI with a plan to overdose or shot himself.  Patient reports that if he had a pistol it would be over by now.  Patient denies having access to a gun.   Patient reports that he is not able to contract for safety.   Patient reports increased depression associated with takin care of his six adopted children as well as a strained relationship with his wife.  Patient reports due to the increased stress of the his wife not being able to take care of the children has caused the patient to recently relapse and begin to abuse cocaine and alcohol.   Patient reports previous inpatient psychiatric hospitalization at [facility] in 2015.  Patient denies inpatient or outpatient substance abuse treatment or detox.   Patient reports that he has a long-standing history of alcohol abuse. Over the last 2-3 months he has relapsed with his drinking and drinks as much liquor as he can daily. His last drink was last night.  Patient denies withdrawal symptoms.  Patient denies a history of seizures.  Per documentation in the file the patient BAL is 37 and his UDS is positive for cocaine.    Patient denies HI and Psychosis.   Diagnosis: Major Depressive Disorder and Alcohol   Past Medical History:  Past Medical History:  Diagnosis Date  . Depression   . Gout   . Hypertension   . Suicidal ideation     History reviewed. No pertinent surgical history.  Family History: No family history on file.  Social History:  reports that he has been smoking Cigarettes.  He does not have any smokeless tobacco history on file. He reports that he drinks alcohol. He reports that he uses drugs, including Cocaine.  Additional Social History:  Alcohol / Drug Use History of alcohol / drug use?: Yes Longest period of sobriety (when/how long): Unable to remember  Negative Consequences of Use: Financial, Personal relationships, Work /  School Withdrawal Symptoms:  (None Reported) Substance #1 Name of Substance 1: Alcohol  1 - Age of First Use: 18 1 - Amount (size/oz): One pint 1 - Frequency: Daily 1 - Duration: For the past couple of months 1 - Last Use / Amount: Last night - he drank one pint  Substance #2 Name of Substance 2: Cocaine  2 - Age of First Use: Patient reports that he is not able to remember 2 - Amount (size/oz): $600- 700 worth of cocaine 2 - Frequency: Patient reports that he has only used twice 2 - Duration: In the past week 2 - Last Use / Amount: Yesterday he used $600 dollars worth   CIWA: CIWA-Ar BP: 164/97 Pulse Rate: 98 Nausea and Vomiting: no nausea and no vomiting Tactile Disturbances: none Tremor: no tremor Auditory Disturbances: not present Paroxysmal Sweats: no sweat visible Visual Disturbances: very mild sensitivity Anxiety: no anxiety, at ease Headache, Fullness in Head: none present Agitation: normal activity Orientation and Clouding of Sensorium: oriented and can do serial additions CIWA-Ar Total: 1 COWS:    PATIENT STRENGTHS: (choose at least two) Average or above average intelligence Capable of independent living Communication skills Financial means Physical Health Supportive family/friends Work skills  Allergies:  Allergies  Allergen Reactions  . Sulfa Antibiotics Rash    Other reaction(s): Fever  . Sulfur Hives and Rash    Home Medications:  (Not in a hospital admission)  OB/GYN Status:  No LMP for  male patient.  General Assessment Data [facility] Assessment: In system Is this a Tele or Face-to-Face Assessment?: Tele Assessment Is this an Initial Assessment or a Re-assessment for this encounter?: Initial Assessment Marital status: Single Maiden name: NA Is patient pregnant?: No Pregnancy Status: No Living Arrangements:  (Lives with his wife and his 6 adopted children) Can pt return to current living arrangement?:  Yes Admission Status: Voluntary Is patient capable of signing voluntary admission?: Yes Referral Source: Self/Family/Friend Insurance type: UHC  Medical Screening Exam [facility] Walk-in ONLY) Medical Exam completed: Yes  Crisis Care Plan Living Arrangements:  (Lives with his wife and his 6 adopted children) Legal Guardian:  (NA) Name of Psychiatrist: None Reported Name of Therapist: None Reported  Education Status Is patient currently in school?: No Current Grade: NA Highest grade of school patient has completed: NA Name of school: NA Contact person: NA  Risk to self with the past 6 months Suicidal Ideation: Yes-Currently Present Has patient been a risk to self within the past 6 months prior to admission? : Yes Suicidal Intent: Yes-Currently Present Has patient had any suicidal intent within the past 6 months prior to admission? : Yes Is patient at risk for suicide?: Yes Suicidal Plan?: Yes-Currently Present Has patient had any suicidal plan within the past 6 months prior to admission? : No Specify Current Suicidal Plan: Overdose on drugs and alcohol or shot himself. Access to Means: Yes Specify Access to Suicidal Means: Alcohol and Drugs What has been your use of drugs/alcohol within the last 12 months?: Cocaine and Alcohol Previous Attempts/Gestures: Yes How many times?: 1 Other Self Harm Risks: NA Triggers for Past Attempts: Family contact, Spouse contact Intentional Self Injurious Behavior: None Family Suicide History: No Recent stressful life event(s): Conflict (Comment) (Strained relationship with his wife) Persecutory voices/beliefs?: No Depression: Yes Depression Symptoms: Despondent, Tearfulness, Isolating, Fatigue, Guilt, Loss of interest in usual pleasures, Feeling worthless/self pity Substance abuse history and/or treatment for substance abuse?: Yes Suicide prevention information given to non-admitted patients: Yes  Risk to Others within the past 6  months Homicidal Ideation: No Does patient have any lifetime risk of violence toward others beyond the six months prior to admission? : No Thoughts of Harm to Others: No Current Homicidal Intent: No Current Homicidal Plan: No Access to Homicidal Means: No Identified Victim: NA History of harm to others?: No Assessment of Violence: None Noted Violent Behavior Description: NA Does patient have access to weapons?: No Criminal Charges Pending?: No Does patient have a court date: No Is patient on probation?: No  Psychosis Hallucinations: None noted Delusions: None noted  Mental Status Report Appearance/Hygiene: In scrubs Eye Contact: Fair Motor Activity: Freedom of movement, Restlessness Speech: Logical/coherent Level of Consciousness: Alert Mood: Depressed, Suspicious, Despair, Helpless, Worthless, low self-esteem Affect: Blunted, Depressed Anxiety Level: Minimal Thought Processes: Coherent, Relevant Judgement: Unimpaired Orientation: Person, Place, Time, Situation Obsessive Compulsive Thoughts/Behaviors: Minimal  Cognitive Functioning Concentration: Decreased Memory: Recent Intact, Remote Intact IQ: Average Insight: Fair Impulse Control: Fair Appetite: Fair Weight Loss: 0 Weight Gain: 0 Sleep: Decreased Total Hours of Sleep: 5 Vegetative Symptoms: Decreased grooming  ADLScreening [facility] Assessment Services) Patient's cognitive ability adequate to safely complete daily activities?: Yes Patient able to express need for assistance with ADLs?: Yes Independently performs ADLs?: Yes (appropriate for developmental age)  Prior Inpatient Therapy Prior Inpatient Therapy: Yes Prior Therapy Dates: 2015 [facility] Reason for Treatment: Substance Abuse and SI  Prior Outpatient Therapy Prior Outpatient Therapy:  No Prior Therapy Dates: NA Prior Therapy Facilty/Provider(s): NA Reason for Treatment: NA Does patient have an ACCT team?: No Does  patient have Intensive In-House Services?  : No Does patient have [facility] services? : No Does patient have P4CC services?: No  ADL Screening (condition at time of admission) Patient's cognitive ability adequate to safely complete daily activities?: Yes Is the patient deaf or have difficulty hearing?: No Does the patient have difficulty seeing, even when wearing glasses/contacts?: No Does the patient have difficulty concentrating, remembering, or making decisions?: No Patient able to express need for assistance with ADLs?: Yes Does the patient have difficulty dressing or bathing?: No Independently performs ADLs?: Yes (appropriate for developmental age) Does the patient have difficulty walking or climbing stairs?: No Weakness of Legs: None Weakness of Arms/Hands: None  Home Assistive Devices/Equipment Home Assistive Devices/Equipment: None    Abuse/Neglect Assessment (Assessment to be complete while patient is alone) Physical Abuse: Denies Verbal Abuse: Denies Sexual Abuse: Denies Exploitation of patient/patient's resources: Denies Self-Neglect: Denies Values / Beliefs Cultural Requests During Hospitalization: None Spiritual Requests During Hospitalization: None Consults Spiritual Care Consult Needed: No Social Work Consult Needed: No Merchant navy officer (For Healthcare) Does patient have an advance directive?: No Would patient like information on creating an advanced directive?: No - patient declined information    Additional Information 1:1 In Past 12 Months?: No CIRT Risk: No Elopement Risk: No Does patient have medical clearance?: Yes     Disposition: Per Fransisca Kaufmann, NP - patient meets criteria for inpatient hospitalization.   Per [facility] Lillia Abed) patient accepted to [facility] Bed 306-1.  The nurse will fax the support voluntary paperwork to [facility].  Dr. Jama Flavors is the accepting physician.   The nurse will coordinate transportation with [place].  Writer informed the patient .     Disposition Initial Assessment Completed for this Encounter: Yes Disposition of Patient: Inpatient treatment program Type of inpatient treatment program: Adult (Per Fransisca Kaufmann, NP - patient meets criteria for inpt hosp )  Phillip Heal LaVerne 05/16/2016 10:57 AM

## 2016-05-16 NOTE — ED Notes (Signed)
[facility]

## 2016-05-16 NOTE — Tx Team (Signed)
Initial Interdisciplinary Treatment Plan   PATIENT STRESSORS: Marital or family conflict Substance abuse   PATIENT STRENGTHS: Average or above average intelligence Communication skills Work skills   PROBLEM LIST: Problem List/Patient Goals Date to be addressed Date deferred Reason deferred Estimated date of resolution  Depression 05/16/16     Substance abuse 05/16/16     Suicidal ideation 05/16/16     "Stop drinking" 05/16/16     "Get the right combination of medicine" 05/16/16                              DISCHARGE CRITERIA:  Improved stabilization in mood, thinking, and/or behavior Verbal commitment to aftercare and medication compliance Withdrawal symptoms are absent or subacute and managed without 24-hour nursing intervention  PRELIMINARY DISCHARGE PLAN: Outpatient therapy Medication management  PATIENT/FAMIILY INVOLVEMENT: This treatment plan has been presented to and reviewed with the patient, Chrissie NoaForrest L Kelemen.  The patient and family have been given the opportunity to ask questions and make suggestions.  Norm ParcelHeather V Tasheema Perrone 05/16/2016, 2:57 PM

## 2016-05-16 NOTE — ED Notes (Signed)
EDP at bedside  

## 2016-05-16 NOTE — BH Assessment (Signed)
Writer asked the nurse to place the Tele Psych machine in the patients room.

## 2016-05-16 NOTE — ED Triage Notes (Signed)
Pt. reports suicidal ideation but did not disclose his plan of suicide at triage , denies hallucinations .

## 2016-05-16 NOTE — ED Notes (Signed)
Staffing office and charge nurse notified on pt.'s sitter , purple scrubs given to pt. , pt.'s personal belongings bagged and labelled at nurses station .

## 2016-05-16 NOTE — ED Notes (Signed)
Pt. Requesting no visitors from his family.

## 2016-05-16 NOTE — ED Notes (Signed)
BH contacted.

## 2016-05-16 NOTE — BH Assessment (Signed)
Disposition: Per [name], NP - patient meets criteria for inpatient hospitalization.   [facility]  The nurse [facility]  Dr. Jama Flavorsobos is the accepting physician.   The nurse will coordinate transportation with [place]. [facility].  The number to call report is [phone].

## 2016-05-16 NOTE — ED Notes (Signed)
Pt called for triage, no response. 

## 2016-05-16 NOTE — ED Provider Notes (Signed)
[facility]-EMERGENCY DEPT Provider Note   CSN: [id number] Arrival date & time: 05/16/16  7829  First Provider Contact:  None       History   Chief Complaint Chief Complaint  Patient presents with  . Suicidal    HPI Terry Hill is a 60 y.o. male.  The history is provided by the patient. No language interpreter was used.    Terry Hill is a 60 y.o. male who presents to the Emergency Department complaining of SI.  He has a long-standing history of alcohol abuse. Over the last 2-3 months he has relapsed with his drinking and drinks as much liquor as he can daily. His last drink was last night. He came in today because he doesn't want to live like this anymore and has suicidal thoughts. He states that if he had a pistol it would be over by now. He denies any history of alcohol withdrawal. He used cocaine last night and that is not normal for him. Denies any fevers, chest pain or shortness breath, vomiting, diarrhea, hallucinations.  Past Medical History:  Diagnosis Date  . Depression   . Gout   . Hypertension   . Suicidal ideation     Patient Active Problem List   Diagnosis Date Noted  . Alcohol dependence (HCC) 05/16/2016  . MDD (major depressive disorder), single episode, severe , no psychosis (HCC)   . Cocaine use disorder, severe, dependence (HCC)   . Alcohol use disorder, severe, dependence (HCC)   . 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 Severe major depression without psychotic features (HCC) 09/08/2014  . BP (high blood pressure) 02/23/2014    History reviewed. No pertinent surgical history.     Home Medications    Prior to Admission medications   Medication Sig Start Date End Date Taking? Authorizing Provider  amLODipine (NORVASC) 10 MG tablet Take 1 tablet (10 mg total) by mouth daily with breakfast. 09/12/14  Yes Thermon Leyland, NP  olmesartan-hydrochlorothiazide (BENICAR HCT) 40-25 MG per tablet Take  1 tablet by mouth daily with breakfast. 09/12/14  Yes Thermon Leyland, NP  FLUoxetine (PROZAC) 40 MG capsule Take 1 capsule (40 mg total) by mouth daily. 09/12/14   Thermon Leyland, NP  tadalafil (CIALIS) 5 MG tablet Take 1 tablet (5 mg total) by mouth daily with breakfast. 09/12/14   Thermon Leyland, NP  traZODone (DESYREL) 100 MG tablet Take 1 tablet (100 mg total) by mouth at bedtime. 09/12/14   Thermon Leyland, NP    Family History No family history on file.  Social History Social History  Substance Use Topics  . Smoking status: Current Every Day Smoker    Types: Cigarettes  . Smokeless tobacco: Never Used  . Alcohol use Yes     Comment: daily usage (1 pint daily)     Allergies   Sulfa antibiotics and Sulfur   Review of Systems Review of Systems  All other systems reviewed and are negative.    Physical Exam Updated Vital Signs BP 130/99   Pulse 84   Temp 98.2 F (36.8 C)   Resp 18   Ht  (1.88 m)   Wt 201 lb (91.2 kg)   SpO2 99%   BMI 25.81 kg/m   Physical Exam  Constitutional: He is oriented to person, place, and time. He appears well-developed and well-nourished.  HENT:  Head: Normocephalic and atraumatic.  Cardiovascular: Normal rate and regular rhythm.  No murmur heard. Pulmonary/Chest: Effort normal and breath sounds normal. No respiratory distress.  Abdominal: Soft. There is no tenderness. There is no rebound and no guarding.  Musculoskeletal: He exhibits no edema or tenderness.  Neurological: He is alert and oriented to person, place, and time.  Skin: Skin is warm and dry.  Psychiatric:  Depressed mood, flat affect  Nursing note and vitals reviewed.    ED Treatments / Results  Labs (all labs ordered are listed, but only abnormal results are displayed) Labs Reviewed  COMPREHENSIVE METABOLIC PANEL - Abnormal; Notable for the following:       Result Value   CO2 21 (*)    Glucose, Bld 111 (*)    Anion gap 16 (*)    All other components within  normal limits  ETHANOL - Abnormal; Notable for the following:    Alcohol, Ethyl (B) 37 (*)    All other components within normal limits  ACETAMINOPHEN LEVEL - Abnormal; Notable for the following:    Acetaminophen (Tylenol), Serum <10 (*)    All other components within normal limits  URINE RAPID DRUG SCREEN, HOSP PERFORMED - Abnormal; Notable for the following:    Cocaine POSITIVE (*)    All other components within normal limits  SALICYLATE LEVEL  CBC    EKG  EKG Interpretation None       Radiology No results found.  Procedures Procedures (including critical care time)  Medications Ordered in ED Medications - No data to display   Initial Impression / Assessment and Plan / ED Course  I have reviewed the triage vital signs and the nursing notes.  Pertinent labs & imaging results that were available during my care of the patient were reviewed by me and considered in my medical decision making (see chart for details).  Clinical Course    Patient with history of EtOH abuse here with suicidal ideations. He is not in active withdrawals in the emergency department. He has been medically cleared for psychiatric treatment. Plan to transfer to behavioral health for further treatment of his suicidal thoughts.  Final Clinical Impressions(s) / ED Diagnoses   Final diagnoses:  Suicidal ideation    New Prescriptions Discharge Medication List as of 05/16/2016  1:39 PM       Tilden FossaElizabeth Oliverio Cho, MD 05/16/16 [phone]

## 2016-05-16 NOTE — ED Notes (Signed)
Security wanded pt. at triage rm. 2 

## 2016-05-17 DIAGNOSIS — F10230 Alcohol dependence with withdrawal, uncomplicated: Secondary | ICD-10-CM

## 2016-05-17 MED ORDER — DIPHENHYDRAMINE HCL 50 MG/ML IJ SOLN
50.0000 mg | Freq: Once | INTRAMUSCULAR | Status: AC
  Administered 2016-05-17: 50 mg via INTRAMUSCULAR

## 2016-05-17 MED ORDER — FLUOXETINE HCL 20 MG PO CAPS
20.0000 mg | ORAL_CAPSULE | Freq: Every day | ORAL | Status: DC
  Administered 2016-05-17 – 2016-05-22 (×6): 20 mg via ORAL
  Filled 2016-05-17 (×10): qty 1

## 2016-05-17 MED ORDER — DIPHENHYDRAMINE HCL 50 MG/ML IJ SOLN
INTRAMUSCULAR | Status: AC
  Administered 2016-05-17: 50 mg via INTRAMUSCULAR
  Filled 2016-05-17: qty 1

## 2016-05-17 NOTE — Progress Notes (Signed)
Writer observed patient at beginning of shift lying in bed resting he reports that he was tired and needed to rest since being admitted this afternoon. He did not attend group but was compliant with his scheduled medication. He received snack and returned to his room to rest. Support given and safety maintained on unit with 15 min checks.

## 2016-05-17 NOTE — H&P (Signed)
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 Alcohol dependence (HCC) [F10.20] 05/16/2016  . MDD (major depressive disorder), single episode, severe , no psychosis (HCC) [F32.2]   . Cocaine use disorder, severe, dependence (HCC) [F14.20]   . Alcohol use disorder, severe, dependence (HCC) [F10.20]   . 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 BP (high blood pressure) [I10] 02/23/2014   History of Present Illness: Per Tele-assessment-Bane L Benjaminis a 60 year old [place] American male that reports SI with a plan to overdose or shot himself.  Patient reports that if hehad a pistol it would be over by now.  Patient denies having access to a gun.   Patient reports that he is not able to contract for safety. Patient reports increased depression associated with takin care of his six adopted children as well as a strained relationship with his wife.  Patient reports due to the increased stress of the his wife not being able to take care of the children has caused the patient to recently relapse and begin to abuse cocaine and alcohol.  Patient reports previous inpatient psychiatric hospitalization at [facility] in 2015.  Patient denies inpatient or outpatient substance abuse treatment or detox.   Patient reports that he has a long-standing history of alcohol abuse. Over the last 2-3 months he has relapsed with his drinking and drinks as much liquor as he can daily. His last drinkwas last night. Patient denies withdrawal symptoms.  Patient denies a history of seizures.  Per  documentation in the file the patient BAL is 37 and his UDS is positive for cocaine.   Patient denies HI and Psychosis.  On evaluation: DRAVON NOTT is awake, alert and oriented X4 , found attending group session.  Denies suicidal or homicidal ideation. Denies auditory or visual hallucination and does not appear to be responding to internal stimuli. Patient reports previous inpatient admissions. States he never followed through with mediation regime. Patient reports family stressors. Patient validates the information provided above. 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 Support, encouragement and reassurance was provided.   Associated Signs/Symptoms: Depression Symptoms:  depressed mood, feelings of worthlessness/guilt, difficulty concentrating, anxiety, (Hypo) Manic Symptoms:  Irritable Mood, Anxiety Symptoms:  Excessive Worry, Psychotic Symptoms:  Hallucinations: None PTSD Symptoms: Avoidance:  Decreased Interest/Participation patient reports hx of miliiatry reports possib. PTSD (never diagnosed) Total Time spent with patient: 30 minutes  Past Psychiatric History: See Above  Is the patient at risk to self? Yes.    Has the patient been a risk to self in the past 6 months? Yes.    Has the patient been a risk to self within the distant past? No.  Is the patient a risk to others? No.  Has the patient been a risk to others in the past 6 months? No.  Has the patient been a risk to others within the distant past? No.   Prior Inpatient Therapy:   Prior Outpatient Therapy:    Alcohol Screening: 1. How often do you have a drink containing alcohol?: 4 or more times a week 2. How many  drinks containing alcohol do you have on a typical day when you are drinking?: 3 or 4 3. How often do you have six or more drinks on one occasion?: Monthly Preliminary Score: 3 4. How often during the last year have you found that you were not  able to stop drinking once you had started?: Weekly 5. How often during the last year have you failed to do what was normally expected from you becasue of drinking?: Monthly 6. How often during the last year have you needed a first drink in the morning to get yourself going after a heavy drinking session?: Never 7. How often during the last year have you had a feeling of guilt of remorse after drinking?: Daily or almost daily 8. How often during the last year have you been unable to remember what happened the night before because you had been drinking?: Monthly 9. Have you or someone else been injured as a result of your drinking?: No 10. Has a relative or friend or a doctor or another health worker been concerned about your drinking or suggested you cut down?: Yes, during the last year Alcohol Use Disorder Identification Test Final Score (AUDIT): 22 Brief Intervention: Yes Substance Abuse History in the last 12 months:  Yes.   Consequences of Substance Abuse: Withdrawal Symptoms:   Headaches Previous Psychotropic Medications: no Psychological Evaluations: no Past Medical History:  Past Medical History:  Diagnosis Date  . Depression   . Gout   . Hypertension   . Suicidal ideation    History reviewed. No pertinent surgical history. Family History: History reviewed. No pertinent family history. Family Psychiatric  History: patient reports depression is prevalent on both sides of his family Tobacco Screening: Have you used any form of tobacco in the last 30 days? (Cigarettes, Smokeless Tobacco, Cigars, and/or Pipes): Yes Tobacco use, Select all that apply: 5 or more cigarettes per day Are you interested in Tobacco Cessation Medications?: Yes, will notify MD for an order Counseled patient on smoking cessation including recognizing danger situations, developing coping skills and basic information about quitting provided: Refused/Declined practical counseling Social History:  History  Alcohol  Use  . Yes    Comment: daily usage (1 pint daily)     History  Drug Use  . Types: Cocaine    Additional Social History:                           Allergies:   Allergies  Allergen Reactions  . Sulfa Antibiotics Rash    Other reaction(s): Fever  . Sulfur Hives and Rash   Lab Results:  Results for orders placed or performed during the hospital encounter of 05/16/16 (from the past 48 hour(s))  Comprehensive metabolic panel     Status: Abnormal   Collection Time: 05/16/16  6:24 AM  Result Value Ref Range   Sodium 138 135 - 145 mmol/L   Potassium 3.6 3.5 - 5.1 mmol/L   Chloride 101 101 - 111 mmol/L   CO2 21 (L) 22 - 32 mmol/L   Glucose, Bld 111 (H) 65 - 99 mg/dL   BUN 14 6 - 20 mg/dL   Creatinine, Ser 6.46 0.61 - 1.24 mg/dL   Calcium 9.5 8.9 - 31.8 mg/dL   Total Protein 8.1 6.5 - 8.1 g/dL   Albumin 4.5 3.5 - 5.0 g/dL   AST 28 15 - 41 U/L   ALT 28 17 - 63 U/L   Alkaline Phosphatase 67  38 - 126 U/L   Total Bilirubin 0.5 0.3 - 1.2 mg/dL   GFR calc non Af Amer >60 >60 mL/min   GFR calc Af Amer >60 >60 mL/min    Comment: (NOTE) The eGFR has been calculated using the CKD EPI equation. This calculation has not been validated in all clinical situations. eGFR's persistently <60 mL/min signify possible Chronic Kidney Disease.    Anion gap 16 (H) 5 - 15  Ethanol     Status: Abnormal   Collection Time: 05/16/16  6:24 AM  Result Value Ref Range   Alcohol, Ethyl (B) 37 (H) <5 mg/dL    Comment:        LOWEST DETECTABLE LIMIT FOR SERUM ALCOHOL IS 5 mg/dL FOR MEDICAL PURPOSES ONLY   Salicylate level     Status: None   Collection Time: 05/16/16  6:24 AM  Result Value Ref Range   Salicylate Lvl <6.7 2.8 - 30.0 mg/dL  Acetaminophen level     Status: Abnormal   Collection Time: 05/16/16  6:24 AM  Result Value Ref Range   Acetaminophen (Tylenol), Serum <10 (L) 10 - 30 ug/mL    Comment:        THERAPEUTIC CONCENTRATIONS VARY SIGNIFICANTLY. A RANGE OF 10-30 ug/mL MAY BE  AN EFFECTIVE CONCENTRATION FOR MANY PATIENTS. HOWEVER, SOME ARE BEST TREATED AT CONCENTRATIONS OUTSIDE THIS RANGE. ACETAMINOPHEN CONCENTRATIONS >150 ug/mL AT 4 HOURS AFTER INGESTION AND >50 ug/mL AT 12 HOURS AFTER INGESTION ARE OFTEN ASSOCIATED WITH TOXIC REACTIONS.   cbc     Status: None   Collection Time: 05/16/16  6:24 AM  Result Value Ref Range   WBC 10.3 4.0 - 10.5 K/uL   RBC 4.77 4.22 - 5.81 MIL/uL   Hemoglobin 14.4 13.0 - 17.0 g/dL   HCT 42.5 39.0 - 52.0 %   MCV 89.1 78.0 - 100.0 fL   MCH 30.2 26.0 - 34.0 pg   MCHC 33.9 30.0 - 36.0 g/dL   RDW 13.8 11.5 - 15.5 %   Platelets 239 150 - 400 K/uL  Rapid urine drug screen (hospital performed)     Status: Abnormal   Collection Time: 05/16/16  6:53 AM  Result Value Ref Range   Opiates NONE DETECTED NONE DETECTED   Cocaine POSITIVE (A) NONE DETECTED   Benzodiazepines NONE DETECTED NONE DETECTED   Amphetamines NONE DETECTED NONE DETECTED   Tetrahydrocannabinol NONE DETECTED NONE DETECTED   Barbiturates NONE DETECTED NONE DETECTED    Comment:        DRUG SCREEN FOR MEDICAL PURPOSES ONLY.  IF CONFIRMATION IS NEEDED FOR ANY PURPOSE, NOTIFY LAB WITHIN 5 DAYS.        LOWEST DETECTABLE LIMITS FOR URINE DRUG SCREEN Drug Class       Cutoff (ng/mL) Amphetamine      1000 Barbiturate      200 Benzodiazepine   124 Tricyclics       580 Opiates          300 Cocaine          300 THC              50     Blood Alcohol level:  Lab Results  Component Value Date   ETH 37 (H) 05/16/2016   ETH 51 (H) 99/83/3825    Metabolic Disorder Labs:  No results found for: HGBA1C, MPG No results found for: PROLACTIN No results found for: CHOL, TRIG, HDL, CHOLHDL, VLDL, LDLCALC  Current Medications: Current Facility-Administered Medications  Medication Dose Route  Frequency Provider Last Rate Last Dose  . acetaminophen (TYLENOL) tablet 650 mg  650 mg Oral Q6H PRN Niel Hummer, NP      . alum & mag hydroxide-simeth (MAALOX/MYLANTA)  200-200-20 MG/5ML suspension 30 mL  30 mL Oral Q4H PRN Niel Hummer, NP      . amLODipine (NORVASC) tablet 2.5 mg  2.5 mg Oral Q breakfast Laverle Hobby, PA-C      . chlordiazePOXIDE (LIBRIUM) capsule 25 mg  25 mg Oral Q6H PRN Niel Hummer, NP      . chlordiazePOXIDE (LIBRIUM) capsule 25 mg  25 mg Oral TID Niel Hummer, NP       Followed by  . [START ON 05/18/2016] chlordiazePOXIDE (LIBRIUM) capsule 25 mg  25 mg Oral BH-qamhs Niel Hummer, NP       Followed by  . [START ON 05/19/2016] chlordiazePOXIDE (LIBRIUM) capsule 25 mg  25 mg Oral Daily Niel Hummer, NP      . irbesartan (AVAPRO) tablet 300 mg  300 mg Oral Daily Jenne Campus, MD       And  . hydrochlorothiazide (HYDRODIURIL) tablet 25 mg  25 mg Oral Daily Myer Peer Cobos, MD      . hydrocortisone cream 1 % 1 application  1 application Topical PRN Kerrie Buffalo, NP   1 application at 02/63/78 [phone]  . hydrOXYzine (ATARAX/VISTARIL) tablet 25 mg  25 mg Oral Q6H PRN Niel Hummer, NP      . loperamide (IMODIUM) capsule 2-4 mg  2-4 mg Oral PRN Niel Hummer, NP      . magnesium hydroxide (MILK OF MAGNESIA) suspension 30 mL  30 mL Oral Daily PRN Niel Hummer, NP      . multivitamin with minerals tablet 1 tablet  1 tablet Oral Daily Niel Hummer, NP   1 tablet at 05/16/16 1725  . nicotine polacrilex (NICORETTE) gum 2 mg  2 mg Oral PRN Jenne Campus, MD      . ondansetron (ZOFRAN-ODT) disintegrating tablet 4 mg  4 mg Oral Q6H PRN Niel Hummer, NP      . pneumococcal 23 valent vaccine (PNU-IMMUNE) injection 0.5 mL  0.5 mL Intramuscular Tomorrow-1000 Fernando A Cobos, MD      . thiamine (VITAMIN B-1) tablet 100 mg  100 mg Oral Daily Niel Hummer, NP      . traZODone (DESYREL) tablet 100 mg  100 mg Oral QHS PRN Niel Hummer, NP       PTA Medications: Prescriptions Prior to Admission  Medication Sig Dispense Refill Last Dose  . amLODipine (NORVASC) 10 MG tablet Take 1 tablet (10 mg total) by mouth daily with breakfast.    05/15/2016 at Unknown time  . FLUoxetine (PROZAC) 40 MG capsule Take 1 capsule (40 mg total) by mouth daily. 30 capsule 0   . olmesartan-hydrochlorothiazide (BENICAR HCT) 40-25 MG per tablet Take 1 tablet by mouth daily with breakfast.   05/15/2016 at Unknown time  . tadalafil (CIALIS) 5 MG tablet Take 1 tablet (5 mg total) by mouth daily with breakfast. 10 tablet 0 unk  . traZODone (DESYREL) 100 MG tablet Take 1 tablet (100 mg total) by mouth at bedtime. 30 tablet 0     Musculoskeletal: Strength & Muscle Tone: within normal limits Gait & Station: normal Patient leans: N/A  Psychiatric Specialty Exam: Physical Exam  Nursing note and vitals reviewed. Constitutional: He is oriented to person, place, and time.  HENT:  Head: Normocephalic.  Eyes: Pupils are equal, round, and reactive to light.  Neurological: He is alert and oriented to person, place, and time.  Skin: Skin is warm.  Psychiatric: He has a normal mood and affect. His behavior is normal.    Review of Systems  Psychiatric/Behavioral: Positive for depression, substance abuse and suicidal ideas. Negative for hallucinations. The patient has insomnia.     Blood pressure 102/77, pulse 82, temperature 97.9 F (36.6 C), temperature source Oral, resp. rate 16, height 6' 2.5" (1.892 m), weight 86 kg (189 lb 8 oz), SpO2 99 %.Body mass index is 24 kg/m.  General Appearance: Casual patient reports hives, noted to upper torso  Eye Contact:  Good  Speech:  Clear and Coherent  Volume:  Normal  Mood:  Anxious and Depressed  Affect:  Appropriate  Thought Process:  Coherent  Orientation:  Full (Time, Place, and Person)  Thought Content:  Hallucinations: None  Suicidal Thoughts:  No  Homicidal Thoughts:  No  Memory:  Immediate;   Fair Recent;   Fair Remote;   Fair  Judgement:  Fair  Insight:  Fair  Psychomotor Activity:  Normal  Concentration:  Concentration: Fair  Recall:  Good  Fund of Knowledge:  Good  Language:  Good   Akathisia:  No  Handed:  Right  AIMS (if indicated):     Assets:  Communication Skills Desire for Improvement Financial Resources/Insurance Housing Intimacy  ADL's:  Intact  Cognition:  WNL  Sleep:  Number of Hours: 6.25    Treatment Plan Summary: Daily contact with patient to assess and evaluate symptoms and progress in treatment and Medication management   Continue with Proc mg Prozac '20mg'$  for mood stabilization. Continue with Trazodone 100 mg for insomnia Started on CWIA/ Librium Protocol Will continue to monitor vitals ,medication compliance and treatment side effects while patient is here.  Reviewed labs: Glucose 111 elevated ,BAL - 37, UDS - pos for cocaine CSW will start working on disposition.  Patient to participate in therapeutic milieu   Observation Level/Precautions:  15 minute checks  Laboratory:  CBC Chemistry Profile UDS UA  Psychotherapy:  Individual and group session  Medications:  See Above  Consultations:  Psychiatry  Discharge Concerns:    Estimated UJW:[id number]  Other:     I certify that inpatient services furnished can reasonably be expected to improve the patient's condition.    Derrill Center, NP 8/12/20179:25 AM

## 2016-05-17 NOTE — [facility] Group Notes (Signed)
[facility] Group Notes:  (Clinical Social Work)   08/04/2015     10:00-11:00AM  Summary of Progress/Problems:   In today's process group a decisional balance exercise was used to explore in depth the perceived benefits and costs of alcohol and drugs, as well as the  benefits and costs of replacing these with healthy coping skills.  Patients initially shared their story of what has been going on in their lives and what unhealthy coping techniques led ultimately to their hospitalization.  Motivational Interviewing and the whiteboard were utilized for the exercises.  The patient expressed that he has depression and has been using alcohol to relieve his emotional pain.  This is the second hospitalization he has had, and he stated "enough is enough."  He stated that the alcohol and depression are affecting his sleep, his job, and he cannot do this to his 6 adopted kids.  He was very interactive during group, had a lot of questions, and was referred by CSW to talk to doctor about whether this could be something involved with his brain chemistry.  Type of Therapy:  Group Therapy - Process   Participation Level:  Active  Participation Quality:  Attentive and Sharing  Affect:  Appropriate  Cognitive:  Appropriate  Insight:  Engaged  Engagement in Therapy:  Engaged  Modes of Intervention:  Education, Motivational Interviewing  Ambrose MantleMareida Grossman-Orr, LCSW 05/17/2016, 12:30 PM

## 2016-05-17 NOTE — [facility] Suicide Risk Assessment (Signed)
[facility] Admission Suicide Risk Assessment   Nursing information obtained from:  Patient Demographic factors:  Male Current Mental Status:  Suicidal ideation indicated by patient Loss Factors:  Loss of significant relationship Historical Factors:  Family history of mental illness or substance abuse Risk Reduction Factors:  Responsible for children under 62 years of age, Employed, Living with another person, especially a relative  Total Time spent with patient: 1 hour Principal Problem: Alcohol dependence (HCC) Diagnosis:   Patient Active Problem List   Diagnosis Date Noted  . Alcohol dependence (HCC) [F10.20] 05/16/2016  . MDD (major depressive disorder), single episode, severe , no psychosis (HCC) [F32.2]   . Cocaine use disorder, severe, dependence (HCC) [F14.20]   . Alcohol use disorder, severe, dependence (HCC) [F10.20]   . 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 BP (high blood pressure) [I10] 02/23/2014   Subjective Data: Pt reports he is depressed. He and his wife adopted 6 children and his wife has not been able to handle it. Pt states he started drinking alcohol to cope. He has a hx of noncompliance with meds and states he never gave anything a chance. He is employed and never let his drinking affect his work. He drinks daily and last drink was one day ago. Pt is denying current withdrawal symptoms and states he does not know if he has even had alcohol withdrawal symptoms before. Reports SI without a plan. Denies HI.   Continued Clinical Symptoms:  Alcohol Use Disorder Identification Test Final Score (AUDIT): 22 The "Alcohol Use Disorders Identification Test", Guidelines for Use in Primary Care, Second Edition.  World Science writer [facility]). Score between 0-7:  no or low risk or alcohol related problems. Score between 8-15:  moderate risk of  alcohol related problems. Score between 16-19:  high risk of alcohol related problems. Score 20 or above:  warrants further diagnostic evaluation for alcohol dependence and treatment.   CLINICAL FACTORS:   Depression:   Anhedonia Comorbid alcohol abuse/dependence Hopelessness Severe Alcohol/Substance Abuse/Dependencies Unstable or Poor Therapeutic Relationship   Musculoskeletal: Strength & Muscle Tone: within normal limits Gait & Station: normal Patient leans: N/A  Psychiatric Specialty Exam: Physical Exam  Review of Systems  Gastrointestinal: Negative for abdominal pain, diarrhea, nausea and vomiting.  Neurological: Negative for tremors, seizures and headaches.  Psychiatric/Behavioral: Positive for depression, substance abuse and suicidal ideas. Negative for hallucinations. The patient has insomnia. The patient is not nervous/anxious.     Blood pressure (!) 153/79, pulse 80, temperature 98.3 F (36.8 C), temperature source Oral, resp. rate 18, height 6' 2.5" (1.892 m), weight 86 kg (189 lb 8 oz), SpO2 99 %.Body mass index is 24 kg/m.  General Appearance: Casual  Eye Contact:  Good  Speech:  Clear and Coherent and Normal Rate  Volume:  Normal  Mood:  Depressed  Affect:  Congruent  Thought Process:  Goal Directed  Orientation:  Full (Time, Place, and Person)  Thought Content:  Logical  Suicidal Thoughts:  Yes.  without intent/plan  Homicidal Thoughts:  No  Memory:  Immediate;   Good Recent;   Good Remote;   Good  Judgement:  Poor  Insight:  Shallow  Psychomotor Activity:  Normal  Concentration:  Concentration: Good  Recall:  Good  Fund of Knowledge:  Good  Language:  Good  Akathisia:  No  Handed:  Right  AIMS (if indicated):  Assets:  Communication Skills Desire for Improvement  ADL's:  Intact  Cognition:  WNL  Sleep:  Number of Hours: 6.25      COGNITIVE FEATURES THAT CONTRIBUTE TO RISK:  Polarized thinking and Thought constriction (tunnel vision)     SUICIDE RISK:   Extreme:  Frequent, intense, and enduring suicidal ideation, specific plans, clear subjective and objective intent, impaired self-control, severe dysphoria/symptomatology, many risk factors and no protective factors.   PLAN OF CARE: Admit to inpt psych for treatment of depression and alcohol dep. Daily contact with patient to assess and evaluate symptoms and progress in treatment and Medication management  Continue with Procaz 40 mg PO for mood stabilization. Continue with Trazodone 100 mg for insomnia Started on CWIA/ Librium Protocol Will continue to monitor vitals ,medication compliance and treatment side effects while patient is here.  Reviewed labs Glucose 111 elevated ,BAL - 37, UDS - pos for cocaine CSW will start working on disposition.  Patient to participate in therapeutic milieu  I certify that inpatient services furnished can reasonably be expected to improve the patient's condition.  Oletta DarterSalina Avigdor Dollar, MD 05/17/2016, 8:34 AM

## 2016-05-17 NOTE — Progress Notes (Signed)
Patient came to nusing station to request a librium and sleep aid before bed. He reports that his day was better than yesterday and he has been up a little more today. He reports that he does not want any visitors right now when asked about his support system. He did not elaborate as to why. He got relief from the benadryl injection given on day shift for his itching from a rash he has on his chest. Support given and safety maintained on unit with 15 min checks.

## 2016-05-17 NOTE — Progress Notes (Addendum)
Patient ID: Terry Hill, male   DOB: 04/10/1956, 60 y.o.   MRN: [id number]   D: Pt has been flat and depressed on the unit today. Pt reported a rash all over his body, [name] NP was made aware orders noted to give patient Benadryl 50mg  IM. Pt reported that the Benadryl worked and that he was not itching anymore. . Pt attended all groups and engaged in treatment. Pt reported that his depression was a 6, his hopelessness was a 6, and his anxiety was a 0. Pt reported that his goal was to be happy. Pt reported being negative SI/HI, no AH/VH noted. A: 15 min checks continued for patient safety. R: Pt safety maintained.

## 2016-05-18 ENCOUNTER — Inpatient Hospital Stay (HOSPITAL_COMMUNITY): Payer: 59

## 2016-05-18 ENCOUNTER — Encounter (HOSPITAL_COMMUNITY): Payer: Self-pay | Admitting: Emergency Medicine

## 2016-05-18 MED ORDER — NICOTINE 21 MG/24HR TD PT24
21.0000 mg | MEDICATED_PATCH | Freq: Every day | TRANSDERMAL | Status: DC
  Administered 2016-05-18: 21 mg via TRANSDERMAL
  Filled 2016-05-18 (×4): qty 1

## 2016-05-18 MED ORDER — ASPIRIN 81 MG PO CHEW
324.0000 mg | CHEWABLE_TABLET | Freq: Once | ORAL | Status: AC
  Administered 2016-05-18: 324 mg via ORAL
  Filled 2016-05-18 (×2): qty 4

## 2016-05-18 MED ORDER — NICOTINE 21 MG/24HR TD PT24
MEDICATED_PATCH | TRANSDERMAL | Status: AC
  Administered 2016-05-18: 10:00:00
  Filled 2016-05-18: qty 1

## 2016-05-18 NOTE — [facility] Group Notes (Signed)
[facility] Group Notes:  (Clinical Social Work)  05/18/2016  10:00-11:00AM  Summary of Progress/Problems:   The main focus of today's process group was to   1)  discuss the importance of adding supports  2)  define health supports versus unhealthy supports  3)  Write a letter to someone asking for their support, defining what the patient feels is needed  An emphasis was placed on using counselor, doctor, therapy groups, 12-step groups, and problem-specific support groups to expand supports.    The patient expressed full comprehension of the concepts presented, and agreed that there is a need to add more supports.  The patient stated he wants to change how he has thought he could do "this" on his own, and wants to begin asking for and accepting help.  He read his letter which was to his adult daughters asking them not to always take their mother's side in things.  The group gave him some feedback about how to improve his request to his daughters by making I statements and explaining what he actually needs from them.  Type of Therapy:  Process Group with Motivational Interviewing  Participation Level:  Active  Participation Quality:  Attentive, Sharing and Supportive  Affect:  Appropriate  Cognitive:  Appropriate  Insight:  Developing/Improving  Engagement in Therapy:  Engaged  Modes of Intervention:   Education, Support and Processing, Activity  Ambrose MantleMareida Grossman-Orr, LCSW 05/18/2016

## 2016-05-18 NOTE — B[facility] Group Notes (Signed)
Patient attend group. His day was a 5. he wants to get his medication under control so when he not here he know his medication works for him. He just start back taking his medication. He needs to stay on track and stay focus.

## 2016-05-18 NOTE — Progress Notes (Signed)
Operating Room Services MD Progress Note  05/18/2016 3:25 PM Terry Hill  MRN:  [id number] Subjective:  Patient reports " I am irritated with the people on this unit."  Objective: Terry Hill is awake, alert and oriented X4. Seen resting in day room interacting with peers. Patient reports  attending group session.  Denies suicidal or homicidal ideation. Denies auditory or visual hallucination and does not appear to be responding to internal stimuli. Patient didn't care to elaborate on his current irration with others. Patient reports he is medication compliant without mediation side effects. States reports depression 4/10 today. Rn had concerns that patient is reporting Chest pain with pain radiation. Patient reports "pinched never." states past history of cardiac issues. Reports the symptoms are not the same. 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  Reports good appetite other wise and resting well.  Support, encouragement and reassurance was provided.    Principal Problem: Alcohol dependence (HCC) Diagnosis:   Patient Active Problem List   Diagnosis Date Noted  . Alcohol dependence (HCC) [F10.20] 05/16/2016  . MDD (major depressive disorder), single episode, severe , no psychosis (HCC) [F32.2]   . Cocaine use disorder, severe, dependence (HCC) [F14.20]   . Alcohol use disorder, severe, dependence (HCC) [F10.20]   . 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 BP (high blood pressure) [I10] 02/23/2014   Total Time spent with patient: 30 minutes  Past Psychiatric History: See Above Past Medical History:  Past Medical History:  Diagnosis Date  . Depression   . Gout   . Hypertension   . Suicidal ideation    History reviewed. No pertinent surgical history. Family History: History reviewed. No pertinent family  history. Family Psychiatric  History: See H&P Social History:  History  Alcohol Use  . Yes    Comment: daily usage (1 pint daily)     History  Drug Use  . Types: Cocaine    Social History   Social History  . Marital status: Married    Spouse name: N/A  . Number of children: N/A  . Years of education: N/A   Social History Main Topics  . Smoking status: Current Every Day Smoker    Types: Cigarettes  . Smokeless tobacco: Never Used  . Alcohol use Yes     Comment: daily usage (1 pint daily)  . Drug use:     Types: Cocaine  . Sexual activity: Not Currently   Other Topics Concern  . None   Social History Narrative  . None   Additional Social History:                         Sleep: Fair  Appetite:  Fair  Current Medications: Current Facility-Administered Medications  Medication Dose Route Frequency Provider Last Rate Last Dose  . acetaminophen (TYLENOL) tablet 650 mg  650 mg Oral Q6H PRN Thermon Leyland, NP      . alum & mag hydroxide-simeth (MAALOX/MYLANTA) 200-200-20 MG/5ML suspension 30 mL  30 mL Oral Q4H PRN Thermon Leyland, NP      . amLODipine (NORVASC) tablet 2.5 mg  2.5 mg Oral Q breakfast Kerry Hough, PA-C   2.5 mg at 05/18/16 0752  . chlordiazePOXIDE (LIBRIUM) capsule 25 mg  25 mg Oral Q6H PRN Thermon Leyland, NP   25 mg at  05/17/16 2117  . chlordiazePOXIDE (LIBRIUM) capsule 25 mg  25 mg Oral BH-qamhs Thermon Leyland, NP   25 mg at 05/18/16 0752   Followed by  . [START ON 05/19/2016] chlordiazePOXIDE (LIBRIUM) capsule 25 mg  25 mg Oral Daily Thermon Leyland, NP      . FLUoxetine (PROZAC) capsule 20 mg  20 mg Oral Daily Oneta Rack, NP   20 mg at 05/18/16 0751  . irbesartan (AVAPRO) tablet 300 mg  300 mg Oral Daily Craige Cotta, MD   300 mg at 05/18/16 0751   And  . hydrochlorothiazide (HYDRODIURIL) tablet 25 mg  25 mg Oral Daily Craige Cotta, MD   25 mg at 05/18/16 0751  . hydrocortisone cream 1 % 1 application  1 application Topical PRN  Adonis Brook, NP   1 application at 05/17/16 [phone]  . hydrOXYzine (ATARAX/VISTARIL) tablet 25 mg  25 mg Oral Q6H PRN Thermon Leyland, NP      . loperamide (IMODIUM) capsule 2-4 mg  2-4 mg Oral PRN Thermon Leyland, NP      . magnesium hydroxide (MILK OF MAGNESIA) suspension 30 mL  30 mL Oral Daily PRN Thermon Leyland, NP      . multivitamin with minerals tablet 1 tablet  1 tablet Oral Daily Thermon Leyland, NP   1 tablet at 05/18/16 0751  . nicotine (NICODERM CQ - dosed in mg/24 hours) patch 21 mg  21 mg Transdermal Daily Oneta Rack, NP   21 mg at 05/18/16 1000  . ondansetron (ZOFRAN-ODT) disintegrating tablet 4 mg  4 mg Oral Q6H PRN Thermon Leyland, NP      . pneumococcal 23 valent vaccine (PNU-IMMUNE) injection 0.5 mL  0.5 mL Intramuscular Tomorrow-1000 Rockey Situ Cobos, MD      . thiamine (VITAMIN B-1) tablet 100 mg  100 mg Oral Daily Thermon Leyland, NP   100 mg at 05/18/16 0751  . traZODone (DESYREL) tablet 100 mg  100 mg Oral QHS PRN Thermon Leyland, NP   100 mg at 05/17/16 2118    Lab Results: No results found for this or any previous visit (from the past 48 hour(s)).  Blood Alcohol level:  Lab Results  Component Value Date   ETH 37 (H) 05/16/2016   ETH 51 (H) 09/08/2014    Metabolic Disorder Labs: No results found for: HGBA1C, MPG No results found for: PROLACTIN No results found for: CHOL, TRIG, HDL, CHOLHDL, VLDL, LDLCALC  Physical Findings: AIMS: Facial and Oral Movements Muscles of Facial Expression: None, normal Lips and Perioral Area: None, normal Jaw: None, normal Tongue: None, normal,Extremity Movements Upper (arms, wrists, hands, fingers): None, normal Lower (legs, knees, ankles, toes): None, normal, Trunk Movements Neck, shoulders, hips: None, normal, Overall Severity Severity of abnormal movements (highest score from questions above): None, normal Incapacitation due to abnormal movements: None, normal Patient's awareness of abnormal movements (rate only patient's report):  No Awareness, Dental Status Current problems with teeth and/or dentures?: No Does patient usually wear dentures?: No  CIWA:  CIWA-Ar Total: 0 COWS:     Musculoskeletal: Strength & Muscle Tone: within normal limits Gait & Station: normal Patient leans: N/A  Psychiatric Specialty Exam: Physical Exam  Review of Systems  Musculoskeletal: Positive for back pain, myalgias and neck pain.  Psychiatric/Behavioral: Positive for depression and substance abuse. Negative for hallucinations and suicidal ideas. The patient is nervous/anxious and has insomnia.     Blood pressure 123/77, pulse 64, temperature  98 F (36.7 C), temperature source Oral, resp. rate 18, height 6' 2.5" (1.892 m), weight 86 kg (189 lb 8 oz), SpO2 99 %.Body mass index is 24 kg/m.  General Appearance: Casual  Eye Contact:  Good  Speech:  Clear and Coherent  Volume:  Normal  Mood:  Dysphoric  Affect:  Labile  Thought Process:  Coherent  Orientation:  Full (Time, Place, and Person)  Thought Content:  Hallucinations: None  Suicidal Thoughts:  No  Homicidal Thoughts:  No  Memory:  Immediate;   Fair Recent;   Fair Remote;   Fair  Judgement:  Intact  Insight:  Present  Psychomotor Activity:  Restlessness  Concentration:  Concentration: Fair  Recall:  Good  Fund of Knowledge:  Good  Language:  Good  Akathisia:  No  Handed:  Right  AIMS (if indicated):     Assets:  Communication Skills Desire for Improvement Social Support  ADL's:  Intact  Cognition:  WNL  Sleep:  Number of Hours: 6.25     I agree with current treatment plan on 05/18/2016, Patient seen face-to-face for psychiatric evaluation follow-up, chart reviewed. Reviewed the information documented and agree with the treatment plan.  Treatment Plan Summary: Daily contact with patient to assess and evaluate symptoms and progress in treatment and Medication management  Orders placed for EKG,and Tropin -pending results. Continue with Proc mg Prozac 20mg  for  mood stabilization. Continue with Trazodone 100 mg for insomnia Started on CWIA/ Librium Protocol Will continue to monitor vitals ,medication compliance and treatment side effects while patient is here.  Reviewed labs: Glucose 111 elevated ,BAL - 37, UDS - pos for cocaine CSW will start working on disposition.  Patient to participate in therapeutic milieu  Oneta Rackanika N Eudell Julian, NP 05/18/2016, 3:25 PM

## 2016-05-18 NOTE — ED Notes (Signed)
[facility] Expected date:  Expected time:  Means of arrival:  Comments: EMS 60yo M left shoulder / arm pain

## 2016-05-18 NOTE — ED Triage Notes (Signed)
[facility] pain that radiates down his left arm. Patient states it has been hurting x1 month and usually takes ibuprofen and exercises arm for relief.  [facility] pt unable to take medication and less active so pain has increased. Patient has Hx of 2 cardiac stents. Pt states this pain has no similarities to previous cardiac issues, however BH wanted patient to be evaluated. Patient being treated for alcohol abuse.

## 2016-05-18 NOTE — [facility] Counselor (Signed)
Adult Comprehensive Assessment  Patient ID: Terry Hill, male   DOB: [birth year], 60 y.o.   MRN: [id number]  Information Source: Information source: Patient  Current Stressors:  Educational / Learning stressors: Denies stressors Employment / Job issues: There is nepotism in his job, and he does not like it. Family Relationships: Not "on the same page" with wife, "and never will be."  They have 6 adopted children. Financial / Lack of resources (include bankruptcy): Denies stressors Housing / Lack of housing: Denies stressors Physical health (include injuries & life threatening diseases): Constant pain with left shoulder down his left arm, states he has had a heart attack also.  Bothering him quite a bit.  They say it is a pinched nerves, but he does not believe it is. Social relationships: Denies stressors Substance abuse: Has been drinking daily for last 3-4 months, no other drugs. Bereavement / Loss: Denies stressors  Living/Environment/Situation:  Living Arrangements: Spouse/significant other, Children (wife and 6 children) Living conditions (as described by patient or guardian): Has his own bedroom, wife is in a separate bedroom.  Good conditions.  May leave and get another place. How long has patient lived in current situation?: 4 years at least What is atmosphere in current home: Other (Comment), Chaotic, Paramedic, Abusive (Tense)  Family History:  Marital status: Married Number of Years Married: 30 What types of issues is patient dealing with in the relationship?: States she needs a mental evaluation.  She is verbally abusive to the children. Additional relationship information: They have not shared bedrooms in 4 years and he is thinking about going into a different home.  "Somebody has got to go, and it's probably going to be me." Are you sexually active?: No What is your sexual orientation?: Heterosexual Does patient have children?: Yes How many children?: 8 How is patient's  relationship with their children?: 2 adult children, 2 grandchildren, 6 adopted children (from age 47yo to 64yo) - They love him, he is the one who calms the household.  Childhood History:  By whom was/is the patient raised?: Sibling Additional childhood history information: lost both parents between ages of [age], was raised by sister and her husband Description of patient's relationship with caregiver when they were a child: Before they died, relationship with mother was loving.  Father was out of the picture. Patient's description of current relationship with people who raised him/her: Relationship with sister who raised him is good.  She just moved locally to be around him. How were you disciplined when you got in trouble as a child/adolescent?: Never whipped Does patient have siblings?: Yes Number of Siblings: 3 Description of patient's current relationship with siblings: Brother was murdered in the early 1990's, was much older than pt, so they were not very close, "semi close."  Has 2 sisters, has a good relationship with both.  One just moved here from [place] to be close to him.  The other lives in [place]. Did patient suffer any verbal/emotional/physical/sexual abuse as a child?: No Did patient suffer from severe childhood neglect?: No Has patient ever been sexually abused/assaulted/raped as an adolescent or adult?: No Was the patient ever a victim of a crime or a disaster?: No Witnessed domestic violence?: Yes Has patient been effected by domestic violence as an adult?: No Description of domestic violence: Father was violent toward mother.  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?: No Are There Guns or Other Weapons in Your Home?: No  Financial Resources:   Surveyor, quantityinancial resources: Income from employment, Private insurance Does patient have a representative payee or guardian?: No  Alcohol/Substance Abuse:   What has been your use of drugs/alcohol within the last 12 months?: Alcohol daily for the last 6 months, cocaine on 2 occasions Alcohol/Substance Abuse Treatment Hx: Past Tx, Inpatient If yes, describe treatment: [facility] in 2015, went to AA in the 190s Has alcohol/substance abuse ever caused legal problems?: Yes  Social Support System:   Conservation officer, natureatient's Community Support System: Poor Describe Community Support System: Sister who just moved here Type of faith/religion: Christian/Baptist How does patient's faith help to cope with current illness?: Does not use it at all  Leisure/Recreation:   Leisure and Hobbies: Counselling psychologistMovies, foreign.  Likes to critique movies.  Yardwork  Strengths/Needs:   What things does the patient do well?: Problem solving In what areas does patient struggle / problems for patient: How to get out of his current situation with wife.  Discharge Plan:   Does patient have access to transportation?: Yes Will patient be returning to same living situation after discharge?: Yes Currently receiving community mental health services: No If no, would patient like referral for services when discharged?: Yes (What county?) [facility]([place] - looking for medication management) Does patient have financial barriers related to discharge medications?:  No  Summary/Recommendations:   Summary and Recommendations (to be completed by the evaluator): Patient is a 60yo male admitted to the hospital with alcohol relapse and suicidal ideation and reports primary trigger for admission was strained relationship with wife and increased stress from being primary caregiver for 6 young children.  Patient will benefit from crisis stabilization, medication evaluation, group therapy and psychoeducation, in addition to case management for discharge planning. At discharge it is recommended that Patient adhere to the established discharge plan and continue in treatment.  Lynnell ChadMareida J Grossman-Orr. 05/18/2016

## 2016-05-18 NOTE — [facility] Suicide Risk Assessment (Signed)
[facility] INPATIENT:  Family/Significant Other Suicide Prevention Education  Suicide Prevention Education:  Patient Refusal for Family/Significant Other Suicide Prevention Education: The patient Terry Hill has refused to provide written consent for family/significant other to be provided Family/Significant Other Suicide Prevention Education during admission and/or prior to discharge.  Physician notified.  Brochure was provided to patient, and was reviewed with him.  Carloyn JaegerMareida J Grossman-Orr 05/18/2016, 12:24 PM

## 2016-05-18 NOTE — Progress Notes (Signed)
Writer called NP F. Hobson concerning EKG done and patient complaints of chest pain he rated a 5 and radiating down his left arm. Patient received 324 mg of asprin at 2041 and vitals taken at 2015 were sitting bp 130/76, pulse 66 and respirations 20.  [facility] to notify of transport.

## 2016-05-18 NOTE — Progress Notes (Signed)
Patient ID: Chrissie NoaForrest L Maiello, male   DOB: 07/05/1956, 60 y.o.   MRN: [id number]   D: Pt has been agitated and irritable on the unit today, pt reported that he was tired on the stupid on the unit. Pt reported that he was tired of the clicks. Pt also reported that he was having some chest pain and that no one was doing anything for him. This Clinical research associatewriter asked patient if he had told the NP, pt reported that he did and that she had done nothing. This Clinical research associatewriter went and got [place]an[place] NP, patient was confronted he reported that it was not chest pain and that he thought it was pulled muscle in his arm. Pt reported that he had insurance and that he just wanted a full physical and full work up prior to being discharged. Tanika NP informed patient that he would have to followup with PCP. Pt reported that his depression was a 5, his hopelessness was a 0, and her anxiety was 3. Pt reported that his goal for today was to work on depression.  Pt reported being negative SI/HI, no AH/VH noted. A: 15 min checks continued for patient safety. R: Pt safety maintained.

## 2016-05-18 NOTE — ED Provider Notes (Signed)
[facility]-EMERGENCY DEPT Provider Note   CSN: [id number] Arrival date & time: 05/18/16  2135  By signing my name below, I, Phillis Haggis, attest that this documentation has been prepared under the direction and in the presence of Geoffery Lyons, MD. Electronically Signed: Phillis Haggis, ED Scribe. 05/18/16. 11:41 PM.  First Provider Contact:  None    History   Chief Complaint Chief Complaint  Patient presents with  . Arm Pain   The history is provided by the patient. No language interpreter was used.  HPI Comments: Terry Hill is a 60 y.o. male with a hx of depression, gout, HTN, and SI brought in from [facility] who presents to the Emergency Department complaining of constant, gradually worsening, "nagging" posterior left shoulder pain that radiates down the left arm onset one month ago. Pt states that he has worsening pain with certain movements of the arm. Pt states that he has a hx of cardiac stent placement about 6 years ago and will occasionally feel the pain in his chest, but attributes this to muscle strain. He says that it does not feel like his heart pain. He has been taking Ibuprofen for his pain to no relief. He works as an Personnel officer.  He denies injury, chest pain, SOB, numbness, or weakness.   Past Medical History:  Diagnosis Date  . Depression   . Gout   . Hypertension   . Suicidal ideation     Patient Active Problem List   Diagnosis Date Noted  . Alcohol dependence (HCC) 05/16/2016  . MDD (major depressive disorder), single episode, severe , no psychosis (HCC)   . Cocaine use disorder, severe, dependence (HCC)   . Alcohol use disorder, severe, dependence (HCC)   . 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 Severe major depression without psychotic features (HCC) 09/08/2014  . BP (high blood pressure) 02/23/2014    History reviewed. No pertinent surgical history.     Home Medications    Prior to  Admission medications   Medication Sig Start Date End Date Taking? Authorizing Provider  amLODipine (NORVASC) 10 MG tablet Take 1 tablet (10 mg total) by mouth daily with breakfast. 09/12/14   Thermon Leyland, NP  FLUoxetine (PROZAC) 40 MG capsule Take 1 capsule (40 mg total) by mouth daily. 09/12/14   Thermon Leyland, NP  olmesartan-hydrochlorothiazide [facility] HCT) 40-25 MG per tablet Take 1 tablet by mouth daily with breakfast. 09/12/14   Thermon Leyland, NP  tadalafil (CIALIS) 5 MG tablet Take 1 tablet (5 mg total) by mouth daily with breakfast. 09/12/14   Thermon Leyland, NP  traZODone (DESYREL) 100 MG tablet Take 1 tablet (100 mg total) by mouth at bedtime. 09/12/14   Thermon Leyland, NP    Family History History reviewed. No pertinent family history.  Social History Social History  Substance Use Topics  . Smoking status: Current Every Day Smoker    Types: Cigarettes  . Smokeless tobacco: Never Used  . Alcohol use Yes     Comment: daily usage (1 pint daily)     Allergies   Sulfa antibiotics and Sulfur   Review of Systems Review of Systems  Respiratory: Negative for shortness of breath.   Cardiovascular: Negative for chest pain.  Musculoskeletal: Positive for arthralgias.  Neurological: Negative for weakness and numbness.  All other systems reviewed and are negative.    Physical Exam Updated Vital Signs BP 130/76 (BP Location: Right Arm)  Pulse 66   Temp 98.1 F (36.7 C) (Oral)   Resp 18   Ht 6' 2.5" (1.892 m)   Wt 189 lb 8 oz (86 kg)   SpO2 100% Comment: RA  BMI 24.00 kg/m   Physical Exam  Constitutional: He is oriented to person, place, and time. He appears well-developed and well-nourished.  HENT:  Head: Normocephalic and atraumatic.  Eyes: EOM are normal.  Neck: Normal range of motion.  Cardiovascular: Normal rate, regular rhythm, normal heart sounds and intact distal pulses.   Pulmonary/Chest: Effort normal and breath sounds normal. No respiratory distress. He  exhibits no tenderness.  Abdominal: Soft. He exhibits no distension. There is no tenderness.  Musculoskeletal: Normal range of motion.  Left shoulder appears grossly normal. There is TTP over the upper scapula. Good ROM of the shoulder with pain upon abduction. Able to flex, extend, and oppose all fingers. Sensation intact throughout entire hand.   Neurological: He is alert and oriented to person, place, and time. He displays normal reflexes. No cranial nerve deficit. He exhibits normal muscle tone. Coordination normal.  Skin: Skin is warm and dry.  Psychiatric: He has a normal mood and affect. Judgment normal.  Nursing note and vitals reviewed.    ED Treatments / Results  DIAGNOSTIC STUDIES: Oxygen Saturation is 100% on RA, normal by my interpretation.    COORDINATION OF CARE: 11:38 PM-Discussed treatment plan which includes labs, EKG, and chest x-ray with pt at bedside and pt agreed to plan.    Labs (all labs ordered are listed, but only abnormal results are displayed) Labs Reviewed - No data to display  EKG ED ECG REPORT   Date: 05/19/2016  Rate: 70's  Rhythm: normal sinus rhythm  QRS Axis: normal  Intervals: normal  ST/T Wave abnormalities: normal  Conduction Disutrbances:none  Narrative Interpretation:   Old EKG Reviewed: unchanged  I have personally reviewed the EKG tracing and agree with the computerized printout as noted.   Radiology No results found.  Procedures Procedures (including critical care time)  Medications Ordered in ED Medications  acetaminophen (TYLENOL) tablet 650 mg (not administered)  alum & mag hydroxide-simeth (MAALOX/MYLANTA) 200-200-20 MG/5ML suspension 30 mL (not administered)  magnesium hydroxide (MILK OF MAGNESIA) suspension 30 mL (not administered)  traZODone (DESYREL) tablet 100 mg (100 mg Oral Given 05/17/16 2118)  chlordiazePOXIDE (LIBRIUM) capsule 25 mg (25 mg Oral Not Given 05/18/16 0751)  chlordiazePOXIDE (LIBRIUM) capsule 25 mg  (25 mg Oral Given 05/16/16 2154)    Followed by  chlordiazePOXIDE (LIBRIUM) capsule 25 mg (25 mg Oral Given 05/17/16 1617)    Followed by  chlordiazePOXIDE (LIBRIUM) capsule 25 mg (25 mg Oral Given 05/18/16 2223)    Followed by  chlordiazePOXIDE (LIBRIUM) capsule 25 mg (not administered)  hydrOXYzine (ATARAX/VISTARIL) tablet 25 mg (not administered)  loperamide (IMODIUM) capsule 2-4 mg (not administered)  multivitamin with minerals tablet 1 tablet (1 tablet Oral Given 05/18/16 0751)  ondansetron (ZOFRAN-ODT) disintegrating tablet 4 mg (not administered)  thiamine (VITAMIN B-1) tablet 100 mg (100 mg Oral Given 05/18/16 0751)  pneumococcal 23 valent vaccine (PNU-IMMUNE) injection 0.5 mL (0.5 mLs Intramuscular Not Given 05/17/16 0941)  hydrocortisone cream 1 % 1 application (1 application Topical Given 05/17/16 0616)  amLODipine (NORVASC) tablet 2.5 mg (2.5 mg Oral Given 05/18/16 0752)  irbesartan (AVAPRO) tablet 300 mg (300 mg Oral Given 05/18/16 0751)    And  hydrochlorothiazide (HYDRODIURIL) tablet 25 mg (25 mg Oral Given 05/18/16 0751)  FLUoxetine (PROZAC) capsule 20 mg (20 mg  Oral Given 05/18/16 0751)  nicotine (NICODERM CQ - dosed in mg/24 hours) patch 21 mg (21 mg Transdermal Patch Applied 05/18/16 1000)  diphenhydrAMINE (BENADRYL) injection 50 mg (50 mg Intramuscular Given 05/17/16 0936)  nicotine (NICODERM CQ - dosed in mg/24 hours) 21 mg/24hr patch (  Patch Applied 05/18/16 1000)  aspirin chewable tablet 324 mg (324 mg Oral Given 05/18/16 2041)     Initial Impression / Assessment and Plan / ED Course  I have reviewed the triage vital signs and the nursing notes.  Pertinent labs & imaging results that were available during my care of the patient were reviewed by me and considered in my medical decision making (see chart for details).  Clinical Course      Final Clinical Impressions(s) / ED Diagnoses   Final diagnoses:  None   Patient is a 60 year old male who presents with complaints  of pain in his posterior left chest wall/shoulder. His discomfort seems very musculoskeletal to me and I see nothing in his workup that would indicate a cardiac etiology. His pain is worse with movement and palpation and relieved with rest. He does have a prior history of cardiac stents, however I highly doubt this to be cardiac. He also describes this as different from his prior heart pain. I feel as though he is medically cleared to return to behavioral health for his alcohol treatment. He will be treated with NSAIDs and muscle relaxer.  I personally performed the services described in this documentation, which was scribed in my presence. The recorded information has been reviewed and is accurate.    New Prescriptions New Prescriptions   No medications on file     Geoffery Lyons, MD 05/19/16 [phone]

## 2016-05-19 LAB — CBC WITH DIFFERENTIAL/PLATELET
BASOS ABS: 0 10*3/uL (ref 0.0–0.1)
Basophils Relative: 0 %
Eosinophils Absolute: 0.3 10*3/uL (ref 0.0–0.7)
Eosinophils Relative: 4 %
HEMATOCRIT: 40 % (ref 39.0–52.0)
Hemoglobin: 13.6 g/dL (ref 13.0–17.0)
LYMPHS PCT: 34 %
Lymphs Abs: 2.7 10*3/uL (ref 0.7–4.0)
MCH: 30.2 pg (ref 26.0–34.0)
MCHC: 34 g/dL (ref 30.0–36.0)
MCV: 88.7 fL (ref 78.0–100.0)
Monocytes Absolute: 0.6 10*3/uL (ref 0.1–1.0)
Monocytes Relative: 8 %
NEUTROS ABS: 4.2 10*3/uL (ref 1.7–7.7)
Neutrophils Relative %: 54 %
Platelets: 207 10*3/uL (ref 150–400)
RBC: 4.51 MIL/uL (ref 4.22–5.81)
RDW: 13.9 % (ref 11.5–15.5)
WBC: 7.9 10*3/uL (ref 4.0–10.5)

## 2016-05-19 LAB — BASIC METABOLIC PANEL
ANION GAP: 4 — AB (ref 5–15)
BUN: 12 mg/dL (ref 6–20)
CO2: 30 mmol/L (ref 22–32)
Calcium: 9.1 mg/dL (ref 8.9–10.3)
Chloride: 106 mmol/L (ref 101–111)
Creatinine, Ser: 1.17 mg/dL (ref 0.61–1.24)
GFR calc Af Amer: >60 mL/min (ref >60)
GLUCOSE: 112 mg/dL — AB (ref 65–99)
POTASSIUM: 4.2 mmol/L (ref 3.5–5.1)
Sodium: 140 mmol/L (ref 135–145)

## 2016-05-19 LAB — I-STAT TROPONIN, ED: Troponin i, poc: 0 ng/mL (ref 0.00–0.08)

## 2016-05-19 MED ORDER — NAPROXEN 500 MG PO TABS: 500.0000 mg | ORAL_TABLET | Freq: Two times a day (BID) | ORAL | 0 refills | Status: DC

## 2016-05-19 MED ORDER — NAPROXEN 500 MG PO TABS
500.0000 mg | ORAL_TABLET | Freq: Two times a day (BID) | ORAL | Status: DC
  Administered 2016-05-19 – 2016-05-22 (×8): 500 mg via ORAL
  Filled 2016-05-19 (×14): qty 1

## 2016-05-19 MED ORDER — CYCLOBENZAPRINE HCL 10 MG PO TABS: 10.0000 mg | ORAL_TABLET | Freq: Two times a day (BID) | ORAL | 0 refills | Status: AC | PRN

## 2016-05-19 MED ORDER — NICOTINE POLACRILEX 2 MG MT GUM
2.0000 mg | CHEWING_GUM | OROMUCOSAL | Status: DC | PRN
  Administered 2016-05-19 – 2016-05-20 (×2): 2 mg via ORAL

## 2016-05-19 MED ORDER — HYDROXYZINE HCL 50 MG PO TABS
50.0000 mg | ORAL_TABLET | Freq: Every evening | ORAL | Status: DC | PRN
  Administered 2016-05-20 – 2016-05-21 (×2): 50 mg via ORAL
  Filled 2016-05-19 (×3): qty 1

## 2016-05-19 MED ORDER — CYCLOBENZAPRINE HCL 10 MG PO TABS
10.0000 mg | ORAL_TABLET | Freq: Two times a day (BID) | ORAL | Status: DC | PRN
  Administered 2016-05-19 (×2): 10 mg via ORAL
  Filled 2016-05-19 (×3): qty 1

## 2016-05-19 MED ORDER — GABAPENTIN 300 MG PO CAPS
300.0000 mg | ORAL_CAPSULE | Freq: Every day | ORAL | Status: DC
  Administered 2016-05-19: 300 mg via ORAL
  Filled 2016-05-19 (×3): qty 1

## 2016-05-19 NOTE — Progress Notes (Signed)
Recreation Therapy Notes  Date: 05/19/16 Time: 0930 Location: 300 Group Room  Group Topic: Stress Management  Goal Area(s) Addresses:  Patient will verbalize importance of using healthy stress management.  Patient will identify positive emotions associated with healthy stress management.   Intervention: Stress Management  Activity :  [place] Script.  LRT will introduce the technique of guided imagery to patients.  Patients will follow along with LRT as script is read to engage in activity.  Education:  Stress Management, Discharge Planning.   Education Outcome: Needs additional education  Clinical Observations/Feedback: Pt did not attend group.    Magalie Almon, LRT/CTRS  

## 2016-05-19 NOTE — Progress Notes (Signed)
Patient returned from ED and requested medications as soon as he returned. Writer got orders for naporxen and flexeril to be given. Patient reports being glad that everything checked out fine but seems somewhat anxious and restless. Medications given and encouraged him to try and get some rest. Safety maintained on unit with 15 min checks.

## 2016-05-19 NOTE — Discharge Instructions (Signed)
Naproxen 500 mg twice daily for the next 10 days.  Flexeril as prescribed as needed for pain not relieved with naproxen.  Follow-up with your primary Dr. if not improving in the next week, and return to the ER should your symptoms significantly worsen or change.

## 2016-05-19 NOTE — Progress Notes (Signed)
D: Pt denies SI/HI/AVH. Pt is pleasant and cooperative, just focused on L-shoulder pain and complaints of being dizzy earlier. Pt spent much time in room , but came out for medications.    A: Pt was offered support and encouragement. Pt was given scheduled medications. Pt was encourage to attend groups. Q 15 minute checks were done for safety.   R: Pt is taking medication. Pt receptive to treatment and safety maintained on unit.

## 2016-05-19 NOTE — [facility] Group Notes (Signed)
Patient did not attend group.

## 2016-05-19 NOTE — [facility] Group Notes (Signed)
[facility] LCSW Group Therapy  05/19/2016   1:15 PM   Type of Therapy:  Group Therapy  Participation Level:  Active  Participation Quality:  Attentive  Affect: Appropriate, sharing, supportive  Cognitive:  Alert and Oriented  Insight:  Developing/Improving and Engaged  Engagement in Therapy:  Developing/Improving and Engaged  Modes of Intervention:  Clarification, Confrontation, Discussion, Education, Exploration, Limit-setting, Orientation, Problem-solving, Rapport Building, Dance movement psychotherapisteality Testing, Socialization and Support  Summary of Progress/Problems: The topic for group therapy was feelings about diagnosis.  Pt actively participated in group discussion on their past and current diagnosis and how they feel towards this.  Pt also identified how society and family members judge them, based on their diagnosis as well as stereotypes and stigmas.  Patient participated engaged in therapeutic letter writing activity focused on his addiction as well as his future. He read his letters aloud to the group and identified themes of strength and determination in beating his illness as well as anger. He states "I have a 3- strikes- and- you're- out rule that I have trouble following myself."   Samuella BruinKristin Addam Goeller, MSW, LCSW Clinical Social Worker [facility] [phone]

## 2016-05-19 NOTE — Progress Notes (Signed)
Patient ID: Terry Hill, male   DOB: Oct 26, 1955, 60 y.o.   MRN: [id number] [facility] MD Progress Note  05/19/2016 3:24 PM Terry Hill  MRN:  [id number] Subjective:  Patient seen, chart reviewed and case discussed with nursing staff.  Patient was evaluated at ED for his chest pain yesterday; considered secondary to musculoskeletal.(trop 0 on 8/14)  He states that he feels drowsy and "slow" after taking "cocktail (medication)." He feels fatigue and does not feel he is ready for discharge. He reports relapsing on alcohol use after several months of sobriety, under stressors of discordance with his wife and taking care of his adopted children. He denies SI/HI. He denies AH/VH.   He continues to have shoulder pain which radiates to his left anterior chest; reports feeling better compared to yesterday.    Principal Problem: Alcohol dependence ([place]) Diagnosis:   Patient Active Problem List   Diagnosis Date Noted  . Alcohol dependence ([place]) [F10.20] 05/16/2016  . MDD (major depressive disorder), single episode, severe , no psychosis ([place]) [F32.2]   . Cocaine use disorder, severe, dependence ([place]) [F14.20]   . Alcohol use disorder, severe, dependence ([place]) [F10.20]   . Major depressive disorder without psychotic features ([place]) [F32.9] 09/08/2014    Class: Acute  . Severe major depression without psychotic features ([place]) [F32.2] 09/08/2014  . BP (high blood pressure) [I10] 02/23/2014   Total Time spent with patient: 30 minutes  Past Psychiatric History: See Above Past Medical History:  Past Medical History:  Diagnosis Date  . Depression   . Gout   . Hypertension   . Suicidal ideation    History reviewed. No pertinent surgical history. Family History: History reviewed. No pertinent family history. Family Psychiatric  History: See H&P Social History:  History  Alcohol Use  . Yes    Comment: daily usage (1 pint daily)     History  Drug Use  . Types: Cocaine    Social History    Social History  . Marital status: Married    Spouse name: N/A  . Number of children: N/A  . Years of education: N/A   Social History Main Topics  . Smoking status: Current Every Day Smoker    Types: Cigarettes  . Smokeless tobacco: Never Used  . Alcohol use Yes     Comment: daily usage (1 pint daily)  . Drug use:     Types: Cocaine  . Sexual activity: Not Currently   Other Topics Concern  . None   Social History Narrative  . None   Additional Social History:                         Sleep: Fair  Appetite:  Fair  Current Medications: Current Facility-Administered Medications  Medication Dose Route Frequency Provider Last Rate Last Dose  . acetaminophen (TYLENOL) tablet 650 mg  650 mg Oral Q6H PRN Niel Hummer, NP      . alum & mag hydroxide-simeth (MAALOX/MYLANTA) 200-200-20 MG/5ML suspension 30 mL  30 mL Oral Q4H PRN Niel Hummer, NP      . amLODipine (NORVASC) tablet 2.5 mg  2.5 mg Oral Q breakfast Laverle Hobby, PA-C   2.5 mg at 05/19/16 0857  . cyclobenzaprine (FLEXERIL) tablet 10 mg  10 mg Oral BID PRN Lurena Nida, NP   10 mg at 05/19/16 0205  . FLUoxetine (PROZAC) capsule 20 mg  20 mg Oral Daily Derrill Center, NP   20  mg at 05/19/16 0856  . irbesartan (AVAPRO) tablet 300 mg  300 mg Oral Daily Jenne Campus, MD   300 mg at 05/19/16 0855   And  . hydrochlorothiazide (HYDRODIURIL) tablet 25 mg  25 mg Oral Daily Jenne Campus, MD   25 mg at 05/19/16 0855  . hydrocortisone cream 1 % 1 application  1 application Topical PRN Kerrie Buffalo, NP   1 application at 12/24/20 [phone]  . magnesium hydroxide (MILK OF MAGNESIA) suspension 30 mL  30 mL Oral Daily PRN Niel Hummer, NP      . multivitamin with minerals tablet 1 tablet  1 tablet Oral Daily Niel Hummer, NP   1 tablet at 05/19/16 0855  . naproxen (NAPROSYN) tablet 500 mg  500 mg Oral BID Lurena Nida, NP   500 mg at 05/19/16 0855  . nicotine polacrilex (NICORETTE) gum 2 mg  2 mg Oral PRN Norman Clay, MD      . pneumococcal 23 valent vaccine (PNU-IMMUNE) injection 0.5 mL  0.5 mL Intramuscular Tomorrow-1000 Myer Peer Cobos, MD      . thiamine (VITAMIN B-1) tablet 100 mg  100 mg Oral Daily Niel Hummer, NP   100 mg at 05/19/16 0037    Lab Results:  Results for orders placed or performed during the hospital encounter of 05/16/16 (from the past 48 hour(s))  Basic metabolic panel     Status: Abnormal   Collection Time: 05/18/16 11:51 PM  Result Value Ref Range   Sodium 140 135 - 145 mmol/L   Potassium 4.2 3.5 - 5.1 mmol/L   Chloride 106 101 - 111 mmol/L   CO2 30 22 - 32 mmol/L   Glucose, Bld 112 (H) 65 - 99 mg/dL   BUN 12 6 - 20 mg/dL   Creatinine, Ser 1.17 0.61 - 1.24 mg/dL   Calcium 9.1 8.9 - 10.3 mg/dL   GFR calc non Af Amer >60 >60 mL/min   GFR calc Af Amer >60 >60 mL/min    Comment: (NOTE) The eGFR has been calculated using the CKD EPI equation. This calculation has not been validated in all clinical situations. eGFR's persistently <60 mL/min signify possible Chronic Kidney Disease.    Anion gap 4 (L) 5 - 15  CBC with Differential     Status: None   Collection Time: 05/18/16 11:51 PM  Result Value Ref Range   WBC 7.9 4.0 - 10.5 K/uL   RBC 4.51 4.22 - 5.81 MIL/uL   Hemoglobin 13.6 13.0 - 17.0 g/dL   HCT 40.0 39.0 - 52.0 %   MCV 88.7 78.0 - 100.0 fL   MCH 30.2 26.0 - 34.0 pg   MCHC 34.0 30.0 - 36.0 g/dL   RDW 13.9 11.5 - 15.5 %   Platelets 207 150 - 400 K/uL   Neutrophils Relative % 54 %   Neutro Abs 4.2 1.7 - 7.7 K/uL   Lymphocytes Relative 34 %   Lymphs Abs 2.7 0.7 - 4.0 K/uL   Monocytes Relative 8 %   Monocytes Absolute 0.6 0.1 - 1.0 K/uL   Eosinophils Relative 4 %   Eosinophils Absolute 0.3 0.0 - 0.7 K/uL   Basophils Relative 0 %   Basophils Absolute 0.0 0.0 - 0.1 K/uL  I-stat troponin, ED     Status: None   Collection Time: 05/19/16 12:01 AM  Result Value Ref Range   Troponin i, poc 0.00 0.00 - 0.08 ng/mL   Comment 3  Comment: Due to the  release kinetics of cTnI, a negative result within the first hours of the onset of symptoms does not rule out myocardial infarction with certainty. If myocardial infarction is still suspected, repeat the test at appropriate intervals.     Blood Alcohol level:  Lab Results  Component Value Date   ETH 37 (H) 05/16/2016   ETH 51 (H) 74/05/1447    Metabolic Disorder Labs: No results found for: HGBA1C, MPG No results found for: PROLACTIN No results found for: CHOL, TRIG, HDL, CHOLHDL, VLDL, LDLCALC  Physical Findings: AIMS: Facial and Oral Movements Muscles of Facial Expression: None, normal Lips and Perioral Area: None, normal Jaw: None, normal Tongue: None, normal,Extremity Movements Upper (arms, wrists, hands, fingers): None, normal Lower (legs, knees, ankles, toes): None, normal, Trunk Movements Neck, shoulders, hips: None, normal, Overall Severity Severity of abnormal movements (highest score from questions above): None, normal Incapacitation due to abnormal movements: None, normal Patient's awareness of abnormal movements (rate only patient's report): No Awareness, Dental Status Current problems with teeth and/or dentures?: No Does patient usually wear dentures?: No  CIWA:  CIWA-Ar Total: 0 COWS:     Musculoskeletal: Strength & Muscle Tone: within normal limits Gait & Station: normal Patient leans: N/A  Psychiatric Specialty Exam: Physical Exam  Constitutional: He is oriented to person, place, and time. He appears well-developed and well-nourished.  Neurological: He is alert and oriented to person, place, and time.  No tremors   Psychiatric: His behavior is normal.    Review of Systems  Constitutional: Positive for malaise/fatigue.  Musculoskeletal: Positive for myalgias. Negative for back pain and neck pain.       Shoulder pain   Neurological: Positive for dizziness.  Psychiatric/Behavioral: Positive for depression and substance abuse. Negative for  hallucinations and suicidal ideas. The patient is nervous/anxious and has insomnia.     Blood pressure 127/86, pulse 85, temperature 98.3 F (36.8 C), temperature source Oral, resp. rate 20, height 6' 2.5" (1.892 m), weight 189 lb 8 oz (86 kg), SpO2 100 %.Body mass index is 24 kg/m.  General Appearance: Casual  Eye Contact:  Good  Speech:  Clear and Coherent  Volume:  Normal  Mood:  Dysphoric, slightly constricted  Affect:  Labile  Thought Process:  Coherent  Orientation:  Full (Time, Place, and Person)  Thought Content:  Hallucinations: None  Suicidal Thoughts:  No  Homicidal Thoughts:  No  Memory:  Immediate;   Fair Recent;   Fair Remote;   Fair  Judgement:  Intact  Insight:  Present  Psychomotor Activity:  Normal  Concentration:  Concentration: Fair  Recall:  Good  Fund of Knowledge:  Good  Language:  Good  Akathisia:  No  Handed:  Right  AIMS (if indicated):     Assets:  Communication Skills Desire for Improvement Social Support  ADL's:  Intact  Cognition:  WNL  Sleep:  Number of Hours: 6.25   Assessment 60 year old male with alcohol use disorder, presented with SI with a plan to overdose or shot himself in the setting of relapsed on alcohol use and cocaine after several months of sobriety. Psychosocial stressors including discordance with his wife and taking care of his six adopted children.    # Alcohol use disorder Completed CIWA protocol. Patient is motivated for sobriety and denies any withdrawal symptoms. Will start gabapentin for alcohol craving which would also benefit from his shoulder pain.   # MDD # r/o Alcohol induced mood disorder Continue current medication.  Will start gabapentin as above. Will discontinue Trazodone given his somnolence on today's exam.   Treatment Plan Summary: Daily contact with patient to assess and evaluate symptoms and progress in treatment and Medication management  Continue with Prozac 66m for mood stabilization. Start  gabapentin 300 mg qhs for alcohol craving, shoulder pain Discontinue Trazodone; with concern for dizziness CIWA completed CSW will start working on disposition.  Patient to participate in therapeutic milieu  RNorman Clay MD 05/19/2016, 3:24 PM

## 2016-05-19 NOTE — ED Notes (Signed)
[facility] called to arrange [facility]

## 2016-05-19 NOTE — Tx Team (Signed)
Interdisciplinary Treatment Plan Update (Adult) Date: 05/20/16   Time Reviewed: 9:30 AM  Progress in Treatment: Attending groups: Yes Participating in groups: Yes Taking medication as prescribed: Yes Tolerating medication: Yes Family/Significant other contact made: No, patient declined collateral contact Patient understands diagnosis: Yes Discussing patient identified problems/goals with staff: Yes Medical problems stabilized or resolved: Yes Denies suicidal/homicidal ideation: Yes Issues/concerns per patient self-inventory: Yes Other:  New problem(s) identified: N/A  Discharge Plan or Barriers: Home with outpatient services.     Reason for Continuation of Hospitalization:  Depression Anxiety Medication Stabilization   Comments: N/A  Estimated length of stay: 2-3 days  Patient is a 60yo male admitted to the hospital with alcohol relapse and suicidal ideation and reports primary trigger for admission was strained relationship with wife and increased stress from being primary caregiver for 6 young children.  Patient will benefit from crisis stabilization, medication evaluation, group therapy and psychoeducation, in addition to case management for discharge planning. At discharge it is recommended that Patient adhere to the established discharge plan and continue in treatment.   Review of initial/current patient goals per problem list:  1. Goal(s): Patient will participate in aftercare plan   Met: Yes   Target date: 3-5 days post admission date   As evidenced by: Patient will participate within aftercare plan AEB aftercare provider and housing plan at discharge being identified.  8/15: Goal met. Patient plans to return home to follow up with outpatient services.    2. Goal (s): Patient will exhibit decreased depressive symptoms and suicidal ideations.   Met: No   Target date: 3-5 days post admission date   As evidenced by: Patient will utilize self rating of  depression at 3 or below and demonstrate decreased signs of depression or be deemed stable for discharge by MD.  8/15: Goal progressing.    3. Goal(s): Patient will demonstrate decreased signs and symptoms of anxiety.   Met: No   Target date: 3-5 days post admission date   As evidenced by: Patient will utilize self rating of anxiety at 3 or below and demonstrated decreased signs of anxiety, or be deemed stable for discharge by MD  8/15: Goal progressing.     4. Goal(s): Patient will demonstrate decreased signs of withdrawal due to substance abuse   Met: Yes   Target date: 3-5 days post admission date   As evidenced by: Patient will produce a CIWA/COWS score of 0, have stable vitals signs, and no symptoms of withdrawal  8/15: Goal met. No withdrawal symptoms reported at this time per medical chart.     Attendees: Patient:    Family:    Physician: Dr. Modesta Messing 05/20/2016 9:30 AM  Nursing: Otilio Carpen, Desma Paganini, RN 05/20/2016 9:30 AM  Clinical Social Worker: Erasmo Downer Bastion Bolger, LCSW 05/20/2016 9:30 AM  Other: Peri Maris, LCSW 05/20/2016 9:30 AM  Other:  05/20/2016 9:30 AM  Other: Lars Pinks, Case Manager 05/20/2016 9:30 AM  Other: Agustina Caroli, May Augustin, NP 05/20/2016 9:30 AM  Other:       Scribe for Treatment Team:  Tilden Fossa, [place]

## 2016-05-19 NOTE — Progress Notes (Signed)
D: Pt presents with flat affect and depressed mood. Pt reports feeling tired this morning after returning from the ED due to chest pain. Pt denies chest pain during shift assessment. V/s are stable. Pt rates depression 5/10. Hopelessness 5/10. Anxiety 4/10. Pt denies suicidal thoughts. Pt denies withdrawal symptoms this morning and completed last dose of Librium protocol. Pt c/o ongoing left shoulder pain and received scheduled naproxen. Pt goal is to work on discharge plans today and follow group lessons.  A: Medications reviewed with  Pt. Medications administered as ordered per MD. Verbal support provided. Pt encouraged to attend groups. 15 minute checks performed for safety. R: Pt receptive to tx. Pt verbalizes understanding of med regimen.

## 2016-05-20 MED ORDER — LIDOCAINE 5 % EX PTCH
1.0000 | MEDICATED_PATCH | CUTANEOUS | Status: DC
  Administered 2016-05-20 – 2016-05-22 (×3): 1 via TRANSDERMAL
  Filled 2016-05-20 (×5): qty 1

## 2016-05-20 NOTE — Progress Notes (Signed)
Recreation Therapy Notes   Animal-Assisted Activity (AAA) Program Checklist/Progress Notes Patient Eligibility Criteria Checklist & Daily Group note for Rec TxIntervention  Date: 08.15.2017 Time: 2:45pm [address]   AAA/T Program Assumption of Risk Form signed by Patient/ or Parent Legal Guardian Yes  Patient is free of allergies or sever asthma Yes  Patient reports no fear of animals Yes  Patient reports no history of cruelty to animals Yes  Patient understands his/her participation is voluntary Yes  Behavioral Response: Did not attend. Patient declined pet therapy serviced during admission. Patient desires documented on consent form.   Marykay Lexenise L Rinnah Peppel, LRT/CTRS  Edrik Rundle L 05/20/2016 3:07 PM

## 2016-05-20 NOTE — Progress Notes (Signed)
Patient ID: EAGAN SHIFFLETT, male   DOB: 12-18-1955, 60 y.o.   MRN: [id number] Patient ID: NUEL DEJAYNES, male   DOB: 01-May-1956, 60 y.o.   MRN: [id number] [facility] MD Progress Note  05/20/2016 10:49 AM Stark Jock  MRN:  [id number] Subjective:  Patient seen, chart reviewed and case discussed with nursing staff. Patient reportedly appeared to be drowsy, although he participated in the groups.   Patient endorses dizziness which has been worse today. He reports that he continues to feel depressed, thinking about issues around his wife and his children. He tries to take care of himself first while he is here. He denies SI. He reports shoulder pain; 7/10. He denies insomnia last night.   Principal Problem: Alcohol dependence ([place]) Diagnosis:   Patient Active Problem List   Diagnosis Date Noted  . Alcohol dependence ([place]) [F10.20] 05/16/2016  . MDD (major depressive disorder), single episode, severe , no psychosis ([place]) [F32.2]   . Cocaine use disorder, severe, dependence ([place]) [F14.20]   . Alcohol use disorder, severe, dependence ([place]) [F10.20]   . Major depressive disorder without psychotic features ([place]) [F32.9] 09/08/2014    Class: Acute  . Severe major depression without psychotic features ([place]) [F32.2] 09/08/2014  . BP (high blood pressure) [I10] 02/23/2014   Total Time spent with patient: 20 minutes  Past Psychiatric History: See Above Past Medical History:  Past Medical History:  Diagnosis Date  . Depression   . Gout   . Hypertension   . Suicidal ideation    History reviewed. No pertinent surgical history. Family History: History reviewed. No pertinent family history. Family Psychiatric  History: See H&P Social History:  History  Alcohol Use  . Yes    Comment: daily usage (1 pint daily)     History  Drug Use  . Types: Cocaine    Social History   Social History  . Marital status: Married    Spouse name: N/A  . Number of children: N/A  . Years of education:  N/A   Social History Main Topics  . Smoking status: Current Every Day Smoker    Types: Cigarettes  . Smokeless tobacco: Never Used  . Alcohol use Yes     Comment: daily usage (1 pint daily)  . Drug use:     Types: Cocaine  . Sexual activity: Not Currently   Other Topics Concern  . None   Social History Narrative  . None   Additional Social History:                         Sleep: Fair  Appetite:  Fair  Current Medications: Current Facility-Administered Medications  Medication Dose Route Frequency Provider Last Rate Last Dose  . acetaminophen (TYLENOL) tablet 650 mg  650 mg Oral Q6H PRN Niel Hummer, NP      . alum & mag hydroxide-simeth (MAALOX/MYLANTA) 200-200-20 MG/5ML suspension 30 mL  30 mL Oral Q4H PRN Niel Hummer, NP      . amLODipine (NORVASC) tablet 2.5 mg  2.5 mg Oral Q breakfast Laverle Hobby, PA-C   2.5 mg at 05/20/16 0743  . FLUoxetine (PROZAC) capsule 20 mg  20 mg Oral Daily Derrill Center, NP   20 mg at 05/20/16 0743  . irbesartan (AVAPRO) tablet 300 mg  300 mg Oral Daily Myer Peer Cobos, MD   300 mg at 05/20/16 0743   And  . hydrochlorothiazide (HYDRODIURIL) tablet 25 mg  25 mg  Oral Daily Jenne Campus, MD   25 mg at 05/20/16 0743  . hydrocortisone cream 1 % 1 application  1 application Topical PRN Kerrie Buffalo, NP   1 application at 26/83/41 [phone]  . hydrOXYzine (ATARAX/VISTARIL) tablet 50 mg  50 mg Oral QHS PRN Laverle Hobby, PA-C      . lidocaine (LIDODERM) 5 % 1 patch  1 patch Transdermal Q24H Norman Clay, MD      . magnesium hydroxide (MILK OF MAGNESIA) suspension 30 mL  30 mL Oral Daily PRN Niel Hummer, NP      . multivitamin with minerals tablet 1 tablet  1 tablet Oral Daily Niel Hummer, NP   1 tablet at 05/20/16 0743  . naproxen (NAPROSYN) tablet 500 mg  500 mg Oral BID Lurena Nida, NP   500 mg at 05/20/16 1007  . nicotine polacrilex (NICORETTE) gum 2 mg  2 mg Oral PRN Norman Clay, MD   2 mg at 05/20/16 0744  . pneumococcal  23 valent vaccine (PNU-IMMUNE) injection 0.5 mL  0.5 mL Intramuscular Tomorrow-1000 Myer Peer Cobos, MD      . thiamine (VITAMIN B-1) tablet 100 mg  100 mg Oral Daily Niel Hummer, NP   100 mg at 05/20/16 2979    Lab Results:  Results for orders placed or performed during the hospital encounter of 05/16/16 (from the past 48 hour(s))  Basic metabolic panel     Status: Abnormal   Collection Time: 05/18/16 11:51 PM  Result Value Ref Range   Sodium 140 135 - 145 mmol/L   Potassium 4.2 3.5 - 5.1 mmol/L   Chloride 106 101 - 111 mmol/L   CO2 30 22 - 32 mmol/L   Glucose, Bld 112 (H) 65 - 99 mg/dL   BUN 12 6 - 20 mg/dL   Creatinine, Ser 1.17 0.61 - 1.24 mg/dL   Calcium 9.1 8.9 - 10.3 mg/dL   GFR calc non Af Amer >60 >60 mL/min   GFR calc Af Amer >60 >60 mL/min    Comment: (NOTE) The eGFR has been calculated using the CKD EPI equation. This calculation has not been validated in all clinical situations. eGFR's persistently <60 mL/min signify possible Chronic Kidney Disease.    Anion gap 4 (L) 5 - 15  CBC with Differential     Status: None   Collection Time: 05/18/16 11:51 PM  Result Value Ref Range   WBC 7.9 4.0 - 10.5 K/uL   RBC 4.51 4.22 - 5.81 MIL/uL   Hemoglobin 13.6 13.0 - 17.0 g/dL   HCT 40.0 39.0 - 52.0 %   MCV 88.7 78.0 - 100.0 fL   MCH 30.2 26.0 - 34.0 pg   MCHC 34.0 30.0 - 36.0 g/dL   RDW 13.9 11.5 - 15.5 %   Platelets 207 150 - 400 K/uL   Neutrophils Relative % 54 %   Neutro Abs 4.2 1.7 - 7.7 K/uL   Lymphocytes Relative 34 %   Lymphs Abs 2.7 0.7 - 4.0 K/uL   Monocytes Relative 8 %   Monocytes Absolute 0.6 0.1 - 1.0 K/uL   Eosinophils Relative 4 %   Eosinophils Absolute 0.3 0.0 - 0.7 K/uL   Basophils Relative 0 %   Basophils Absolute 0.0 0.0 - 0.1 K/uL  I-stat troponin, ED     Status: None   Collection Time: 05/19/16 12:01 AM  Result Value Ref Range   Troponin i, poc 0.00 0.00 - 0.08 ng/mL   Comment 3  Comment: Due to the release kinetics of cTnI, a  negative result within the first hours of the onset of symptoms does not rule out myocardial infarction with certainty. If myocardial infarction is still suspected, repeat the test at appropriate intervals.     Blood Alcohol level:  Lab Results  Component Value Date   ETH 37 (H) 05/16/2016   ETH 51 (H) 09/08/2014    Metabolic Disorder Labs: No results found for: HGBA1C, MPG No results found for: PROLACTIN No results found for: CHOL, TRIG, HDL, CHOLHDL, VLDL, LDLCALC  Physical Findings: AIMS: Facial and Oral Movements Muscles of Facial Expression: None, normal Lips and Perioral Area: None, normal Jaw: None, normal Tongue: None, normal,Extremity Movements Upper (arms, wrists, hands, fingers): None, normal Lower (legs, knees, ankles, toes): None, normal, Trunk Movements Neck, shoulders, hips: None, normal, Overall Severity Severity of abnormal movements (highest score from questions above): None, normal Incapacitation due to abnormal movements: None, normal Patient's awareness of abnormal movements (rate only patient's report): No Awareness, Dental Status Current problems with teeth and/or dentures?: No Does patient usually wear dentures?: No  CIWA:  CIWA-Ar Total: 1 COWS:     Musculoskeletal: Strength & Muscle Tone: within normal limits Gait & Station: normal Patient leans: N/A  Psychiatric Specialty Exam: Physical Exam  Constitutional: He is oriented to person, place, and time. He appears well-developed and well-nourished.  Neurological: He is alert and oriented to person, place, and time.  No tremors   Psychiatric: His behavior is normal.    Review of Systems  Constitutional: Positive for malaise/fatigue.  Musculoskeletal: Positive for myalgias. Negative for back pain and neck pain.       Shoulder pain   Neurological: Positive for dizziness.  Psychiatric/Behavioral: Positive for depression and substance abuse. Negative for hallucinations and suicidal ideas. The  patient is nervous/anxious. The patient does not have insomnia.     Blood pressure 99/75, pulse 89, temperature 97.5 F (36.4 C), temperature source Oral, resp. rate 16, height 6' 2.5" (1.892 m), weight 189 lb 8 oz (86 kg), SpO2 100 %.Body mass index is 24 kg/m.  General Appearance: Casual  Eye Contact:  Good  Speech:  Clear and Coherent  Volume:  Normal  Mood:  Depressed  Affect:  down, drowsy  Thought Process:  Coherent  Orientation:  Full (Time, Place, and Person)  Thought Content:  Hallucinations: None  Suicidal Thoughts:  No  Homicidal Thoughts:  No  Memory:  Immediate;   Fair Recent;   Fair Remote;   Fair  Judgement:  Intact  Insight:  Present  Psychomotor Activity:  Normal  Concentration:  Concentration: Fair  Recall:  Good  Fund of Knowledge:  Good  Language:  Good  Akathisia:  No  Handed:  Right  AIMS (if indicated):     Assets:  Communication Skills Desire for Improvement Social Support  ADL's:  Intact  Cognition:  WNL  Sleep:  Number of Hours: 6.25   Assessment 60 year old male with alcohol use disorder, presented with SI with a plan to overdose or shot himself in the setting of relapsed on alcohol use and cocaine after several months of sobriety. Psychosocial stressors including discordance with his wife and taking care of his six adopted children.    # Alcohol use disorder Completed CIWA protocol. Patient is motivated for sobriety and denies any withdrawal symptoms. Will discontinue gabapentin given his drowsiness.   # MDD # r/o Alcohol induced mood disorder He continues to endorse depression. Will continue current meds  given his ongoing dizziness; will discontinue flexeril as well.   # Left shoulder pain Evaluated at ED on 8/13 with concern for CAD (history of stent); it is considered secondary to musculoskeletal pain. Will apply lidocaine patch.  Treatment Plan Summary: Daily contact with patient to assess and evaluate symptoms and progress in  treatment and Medication management  Continue with Prozac 54m for mood stabilization. Discontinued gabapentin, trazodone, flexelil given his ongoing dizziness Start lidocaine patch for his left shoulder pain CIWA completed CSW will continue working on disposition.  Patient to participate in therapeutic milieu  RNorman Clay MD 05/20/2016, 10:49 AM

## 2016-05-20 NOTE — [facility] Group Notes (Signed)
[facility] LCSW Group Therapy 05/20/2016 1:15 PM Type of Therapy: Group Therapy Participation Level: Active  Participation Quality: Attentive, Sharing and Supportive  Affect: Depressed and Flat  Cognitive: Alert and Oriented  Insight: Developing/Improving and Engaged  Engagement in Therapy: Developing/Improving and Engaged  Modes of Intervention: Activity, Clarification, Confrontation, Discussion, Education, Exploration, Limit-setting, Orientation, Problem-solving, Rapport Building, Reality Testing, Socialization and Support  Summary of Progress/Problems: Patient was attentive and engaged with speaker from [employer]. Patient was attentive to speaker while they shared their story of dealing with mental health and overcoming it. Patient expressed interest in their programs and services and received information on their agency. Patient processed ways they can relate to the speaker.   Madyn Ivins, LCSW Clinical Social Worker [facility] [phone]   

## 2016-05-20 NOTE — Plan of Care (Signed)
Problem: Medication: Goal: Compliance with prescribed medication regimen will improve Outcome: Progressing Pt taking medications as prescribed.  Problem: Self-Concept: Goal: Ability to disclose and discuss suicidal ideas will improve Outcome: Progressing Pt denies si thoughts

## 2016-05-20 NOTE — Progress Notes (Signed)
D:Pt rates depression as a 5 on 0-10 scale with 10 being the most. He c/o lt shoulder pain and was given a Lidocaine patch with good results. Pt says that he does not plan to go back to his wife of 30 years and that they have 6 adoptive children. He reports that his wife is "crazy" and needs help herself. Pt denies detox symptoms today. A:Offered support, encouragement and 15 minute checks, R:Pt denies si and hi. Safety maintained on the unit.

## 2016-05-20 NOTE — Progress Notes (Signed)
Patient ID: Chrissie NoaForrest L Persing, male   DOB: 10/30/1955, 60 y.o.   MRN: [id number] D: Client is visible on the unit, seen in dayroom watching TV. Client reports depression "7" of 10. His goal for today: "follow my lessons" (groups, etc). A: Write provided emotional support, encouraged client to report any concerns. Medication reviewed administered as ordered. Staff will monitor q4115min for safety. R: Client is safe on the unit, attended group.

## 2016-05-20 NOTE — [facility] Group Notes (Signed)
[facility] Group Notes:  (Nursing/MHT/Case Management/Adjunct)  Date:  05/20/2016  Time:  9:55 AM  Type of Therapy:  Nurse Education  Participation Level:  Active  Participation Quality:  Appropriate  Affect:  Blunted  Cognitive:  Alert and Oriented  Insight:  Appropriate  Engagement in Group:  Engaged  Modes of Intervention:  Activity, Discussion, Education, Socialization and Support  Summary of Progress/Problems:The purpose of this group is to educate patients on ways to improve sleep and discuss goals. Pt is drowsy this morning and his goal is to discuss medications with his doctor. Beatrix ShipperWright, Rozelia Catapano Martin 05/20/2016, 9:55 AM

## 2016-05-21 NOTE — [facility] Group Notes (Signed)
Patient attend group hi day was a 8. Patient said he had a good day.his medication correct now he feels better. He still working tryihg to stay on track and get things back together.

## 2016-05-21 NOTE — Progress Notes (Signed)
Patient ID: Terry Hill, male   DOB: 04/14/1956, 60 y.o.   MRN: [id number] D: Client reports some agitation in regards to situation earlier today "they found a crack pipe and said it was in my clothes, I don't like to be accused of nothing I didn't do, I'm a grown man, if I had it I would say I had it" "there plenty of clothes washed in that machine before mine, it must have come out of some of those clothes" Client later seen interacting in dayroom, playing cards. A: Writer encouraged client not to be consumed with incident if indeed it was not his paraphernalia. Medication reviewed, administered as ordered. Staff will monitor q6315min for safety. R:Client is safe on the unit, attended group.

## 2016-05-21 NOTE — Progress Notes (Signed)
Recreation Therapy Notes  Date: 05/21/16 Time: 0930 Location: [address] Group Room  Group Topic: Stress Management  Goal Area(s) Addresses:  Patient will verbalize importance of using healthy stress management.  Patient will identify positive emotions associated with healthy stress management.   Behavioral Response: Engaged  Intervention: Stress Management   Activity :  Progressive Muscle Relaxation.  LRT introduced the technique of progressive muscle relaxation to patients.  Patients were to follow along with LRT as script was read to participate in activity.  Education:  Stress Management, Discharge Planning.   Education Outcome: Acknowledges edcuation/In group clarification offered/Needs additional education  Clinical Observations/Feedback: Pt attended group.   Caroll RancherMarjette Scotland Dost, LRT/CTRS       Caroll RancherLindsay, Crysta Gulick A 05/21/2016 3:05 PM

## 2016-05-21 NOTE — [facility] Group Notes (Signed)
[facility] LCSW Group Therapy 05/21/2016  1:15 PM   Type of Therapy: Group Therapy  Participation Level: Did Not Attend. Patient invited to participate but declined.   Samuella BruinKristin Chole Driver, MSW, LCSW Clinical Social Worker [facility] [phone]

## 2016-05-21 NOTE — Progress Notes (Signed)
Adult Psychoeducational Group Note  Date:  05/21/2016 Time:  7:30 PM  Group Topic/Focus:  Orientation:   The focus of this group is to educate the patient on the purpose and policies of crisis stabilization and provide a format to answer questions about their admission.  The group details unit policies and expectations of patients while admitted.   Participation Level:  Minimal  Participation Quality:  Attentive  Affect:  Appropriate  Cognitive:  Appropriate  Insight: Appropriate  Engagement in Group:  None  Modes of Intervention:  Discussion, Education, Limit-setting and Orientation  Additional Comments:   Writer had a orientation group with patient for personal development, discussing hygiene and goal setting for patients reasoning for being in the hospital. Discussed with patient unit rules of the facility, the expectation of participating in groups and discussions of contraband and treatment agreement with not having contraband on the unit. Working on recovery and ways to manage self care and medication regimen. Pt was receptive to information received but not share during the group   Karleen HampshireFox, Walter Grima Brittini 05/21/2016, 7:30 PM

## 2016-05-21 NOTE — Plan of Care (Signed)
Problem: Safety: Goal: Ability to remain free from injury will improve Outcome: Progressing Client is free from injury AEB q115min safety checks and denial of withdrawal symptoms.

## 2016-05-21 NOTE — Progress Notes (Signed)
Patient ID: Terry Hill, male   DOB: 02/24/1956, 60 y.o.   MRN: [id number] [facility]  05/21/2016 10:36 AM Terry Hill  MRN:  [id number] Subjective:  Patient seen, chart reviewed and case discussed with nursing staff. Per report, the staff found a crack in a washer machine with his clothes.   He states that he feels upset about what happened yesterday; he adamantly denies using cocaine and reports it was not his. He felt insulted. He also talks about an episode when other peer in the unit was bothering him. He stayed away trying not to get involved. He continues to feel dizzy but it has been improving. He still feels depressed but it is better compared to yesterday. He took Hydroxyzine as he was anxious. He denies insomnia. He denies any craving for substances.   Principal Problem: Alcohol dependence (HCC) Diagnosis:   Patient Active Problem List   Diagnosis Date Noted  . Alcohol dependence (HCC) [F10.20] 05/16/2016  . MDD (major depressive disorder), single episode, severe , no psychosis (HCC) [F32.2]   . Cocaine use disorder, severe, dependence (HCC) [F14.20]   . Alcohol use disorder, severe, dependence (HCC) [F10.20]   . 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 BP (high blood pressure) [I10] 02/23/2014   Total Time spent with patient: 20 minutes  Past Psychiatric History: See Above Past Medical History:  Past Medical History:  Diagnosis Date  . Depression   . Gout   . Hypertension   . Suicidal ideation    History reviewed. No pertinent surgical history. Family History: History reviewed. No pertinent family history. Family Psychiatric  History: See H&P Social History:  History  Alcohol Use  . Yes    Comment: daily usage (1 pint daily)     History  Drug Use  . Types: Cocaine    Social History   Social History  .  Marital status: Married    Spouse name: N/A  . Number of children: N/A  . Years of education: N/A   Social History Main Topics  . Smoking status: Current Every Day Smoker    Types: Cigarettes  . Smokeless tobacco: Never Used  . Alcohol use Yes     Comment: daily usage (1 pint daily)  . Drug use:     Types: Cocaine  . Sexual activity: Not Currently   Other Topics Concern  . None   Social History Narrative  . None   Additional Social History:                         Sleep: Fair  Appetite:  Fair  Current Medications: Current Facility-Administered Medications  Medication Dose Route Frequency Provider Last Rate Last Dose  . acetaminophen (TYLENOL) tablet 650 mg  650 mg Oral Q6H PRN Thermon LeylandLaura A Davis, NP      . alum & mag hydroxide-simeth (MAALOX/MYLANTA) 200-200-20 MG/5ML suspension 30 mL  30 mL Oral Q4H PRN Thermon LeylandLaura A Davis, NP      . amLODipine (NORVASC) tablet 2.5 mg  2.5 mg Oral Q breakfast Kerry HoughSpencer E Simon, PA-C   2.5 mg at 05/21/16 0741  . FLUoxetine (PROZAC) capsule 20 mg  20 mg Oral Daily Oneta Rackanika N Lewis, NP   20 mg at 05/21/16 0741  . irbesartan (AVAPRO) tablet 300 mg  300 mg Oral Daily Craige CottaFernando A Cobos, MD  300 mg at 05/21/16 0741   And  . hydrochlorothiazide (HYDRODIURIL) tablet 25 mg  25 mg Oral Daily Craige CottaFernando A Cobos, MD   25 mg at 05/21/16 0741  . hydrocortisone cream 1 % 1 application  1 application Topical PRN Adonis BrookSheila Agustin, NP   1 application at 05/17/16 0616  . hydrOXYzine (ATARAX/VISTARIL) tablet 50 mg  50 mg Oral QHS PRN Kerry HoughSpencer E Simon, PA-C   50 mg at 05/20/16 2257  . lidocaine (LIDODERM) 5 % 1 patch  1 patch Transdermal Q24H Neysa Hottereina Hannah Strader, MD   1 patch at 05/20/16 1159  . magnesium hydroxide (MILK OF MAGNESIA) suspension 30 mL  30 mL Oral Daily PRN Thermon LeylandLaura A Davis, NP      . multivitamin with minerals tablet 1 tablet  1 tablet Oral Daily Thermon LeylandLaura A Davis, NP   1 tablet at 05/21/16 0741  . naproxen (NAPROSYN) tablet 500 mg  500 mg Oral BID Kristeen MansFran E Hobson, NP    500 mg at 05/20/16 2121  . nicotine polacrilex (NICORETTE) gum 2 mg  2 mg Oral PRN Neysa Hottereina Feliciana Narayan, MD   2 mg at 05/20/16 0744  . pneumococcal 23 valent vaccine (PNU-IMMUNE) injection 0.5 mL  0.5 mL Intramuscular Tomorrow-1000 Fernando A Cobos, MD      . thiamine (VITAMIN B-1) tablet 100 mg  100 mg Oral Daily Thermon LeylandLaura A Davis, NP   100 mg at 05/21/16 [id number]    Lab Results:  No results found for this or any previous visit (from the past 48 hour(s)).  Blood Alcohol level:  Lab Results  Component Value Date   ETH 37 (H) 05/16/2016   ETH 51 (H) 09/08/2014    Metabolic Disorder Labs: No results found for: HGBA1C, MPG No results found for: PROLACTIN No results found for: CHOL, TRIG, HDL, CHOLHDL, VLDL, LDLCALC  Physical Findings: AIMS: Facial and Oral Movements Muscles of Facial Expression: None, normal Lips and Perioral Area: None, normal Jaw: None, normal Tongue: None, normal,Extremity Movements Upper (arms, wrists, hands, fingers): None, normal Lower (legs, knees, ankles, toes): None, normal, Trunk Movements Neck, shoulders, hips: None, normal, Overall Severity Severity of abnormal movements (highest score from questions above): None, normal Incapacitation due to abnormal movements: None, normal Patient's awareness of abnormal movements (rate only patient's report): No Awareness, Dental Status Current problems with teeth and/or dentures?: No Does patient usually wear dentures?: No  CIWA:  CIWA-Ar Total: 0 COWS:     Musculoskeletal: Strength & Muscle Tone: within normal limits Gait & Station: normal Patient leans: N/A  Psychiatric Specialty Exam: Physical Exam  Constitutional: He is oriented to person, place, and time. He appears well-developed and well-nourished.  Neurological: He is alert and oriented to person, place, and time.  No tremors   Psychiatric: His behavior is normal.    Review of Systems  Constitutional: Positive for malaise/fatigue.  Musculoskeletal: Positive  for myalgias. Negative for back pain and neck pain.       Shoulder pain   Neurological: Positive for dizziness.  Psychiatric/Behavioral: Positive for depression and substance abuse. Negative for hallucinations and suicidal ideas. The patient is nervous/anxious. The patient does not have insomnia.     Blood pressure 125/89, pulse 84, temperature 97.5 F (36.4 C), temperature source Oral, resp. rate 18, height 6' 2.5" (1.892 m), weight 189 lb 8 oz (86 kg), SpO2 100 %.Body mass index is 24 kg/m.  General Appearance: Casual  Eye Contact:  Good  Speech:  Clear and Coherent  Volume:  Normal  Mood:  upset  Affect:  upset, down improving  Thought Process:  Coherent perceptions: denies AH/VH  Orientation:  Full (Time, Place, and Person)  Thought Content:  Hallucinations: None  Suicidal Thoughts:  No  Homicidal Thoughts:  No  Memory:  Immediate;   Fair Recent;   Fair Remote;   Fair  Judgement:  Intact  Insight:  Fair  Psychomotor Activity:  Normal  Concentration:  Concentration: Fair  Recall:  Good  Fund of Knowledge:  Good  Language:  Good  Akathisia:  No  Handed:  Right  AIMS (if indicated):     Assets:  Communication Skills Desire for Improvement Social Support  ADL's:  Intact  Cognition:  WNL  Sleep:  Number of Hours: 6   Assessment 60 year old male with alcohol use disorder, presented with SI with a plan to overdose or shot himself in the setting of relapsed on alcohol use and cocaine after several months of sobriety. Psychosocial stressors including discordance with his wife and taking care of his six adopted children.    # MDD # r/o Alcohol induced mood disorder There is an improvement in his neurovegetative symptoms. Will continue current meds.   # Alcohol use disorder Completed CIWA protocol. Patient is motivated for sobriety and denies any withdrawal symptoms. Continue motivational interview.   # Left shoulder pain Evaluated at ED on 8/13 with concern for CAD  (history of stent); it is considered secondary to musculoskeletal pain. Patient reports good effect from lidocaine patch.   # Dizziness Improved today. Likely secondary to medication side effect. Discontinued a couple of meds as above to minimize the risk of orthostatic/dizziness as below.   Treatment Plan Summary: Daily contact with patient to assess and evaluate symptoms and progress in treatment and Medication management  Continue with Prozac 20mg  for mood stabilization. Discontinued gabapentin, trazodone, flexelil given his ongoing dizziness Start lidocaine patch for his left shoulder pain CIWA completed CSW will continue working on disposition.  Patient to participate in therapeutic milieu  Neysa Hotter, MD 05/21/2016, 10:36 AM

## 2016-05-21 NOTE — Progress Notes (Signed)
D:Pt reported that he felt better this morning and had a brighter affect compared to yesterday. Pt reports that the pain patch has been helping. He plans to go back home and live at one end of the house and his wife at the other. 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 Later in the morning, contraband(drug paraphernalia)  was found in pt's clothes in the laundry room. Pt was confronted by staff and he adamantly denies having the item.  A:Offered support and 15 minute checks.  R:Pt denies si and hi. Safety maintained on the unit.

## 2016-05-22 MED ORDER — LIDOCAINE 5 % EX PTCH: 1.0000 | MEDICATED_PATCH | CUTANEOUS | 0 refills | Status: AC

## 2016-05-22 MED ORDER — NAPROXEN 500 MG PO TABS: 500.0000 mg | ORAL_TABLET | Freq: Two times a day (BID) | ORAL | 0 refills | Status: AC

## 2016-05-22 MED ORDER — FLUOXETINE HCL 20 MG PO CAPS: 20.0000 mg | ORAL_CAPSULE | Freq: Every day | ORAL | 0 refills | Status: AC

## 2016-05-22 MED ORDER — AMLODIPINE BESYLATE 2.5 MG PO TABS: 2.5000 mg | ORAL_TABLET | Freq: Every day | ORAL | 0 refills | Status: DC

## 2016-05-22 MED ORDER — HYDROXYZINE HCL 50 MG PO TABS: 50.0000 mg | ORAL_TABLET | Freq: Every evening | ORAL | 0 refills | Status: AC | PRN

## 2016-05-22 MED ORDER — NICOTINE POLACRILEX 2 MG MT GUM: 2.0000 mg | CHEWING_GUM | OROMUCOSAL | 0 refills | Status: AC | PRN

## 2016-05-22 MED ORDER — OLMESARTAN MEDOXOMIL-HCTZ 40-25 MG PO TABS: 1.0000 | ORAL_TABLET | Freq: Every day | ORAL | Status: AC

## 2016-05-22 MED ORDER — HYDROCORTISONE 1 % EX CREA: 1.0000 "application " | TOPICAL_CREAM | CUTANEOUS | 0 refills | Status: AC | PRN

## 2016-05-22 NOTE — Tx Team (Addendum)
Interdisciplinary Treatment Plan Update (Adult) Date: 05/20/16   Time Reviewed: 9:30 AM  Progress in Treatment: Attending groups: Yes Participating in groups: Yes Taking medication as prescribed: Yes Tolerating medication: Yes Family/Significant other contact made: No, patient declined collateral contact Patient understands diagnosis: Yes Discussing patient identified problems/goals with staff: Yes Medical problems stabilized or resolved: Yes Denies suicidal/homicidal ideation: Yes Issues/concerns per patient self-inventory: Yes Other:  New problem(s) identified: N/A  Discharge Plan or Barriers: Home with outpatient services.     Reason for Continuation of Hospitalization:  Depression Anxiety Medication Stabilization   Comments: N/A  Estimated length of stay: Discharge anticipated for today 05/22/16  Patient is a 60yo male admitted to the hospital with alcohol relapse and suicidal ideation and reports primary trigger for admission was strained relationship with wife and increased stress from being primary caregiver for 6 young children.  Patient will benefit from crisis stabilization, medication evaluation, group therapy and psychoeducation, in addition to case management for discharge planning. At discharge it is recommended that Patient adhere to the established discharge plan and continue in treatment.   Review of initial/current patient goals per problem list:  1. Goal(s): Patient will participate in aftercare plan   Met: Yes   Target date: 3-5 days post admission date   As evidenced by: Patient will participate within aftercare plan AEB aftercare provider and housing plan at discharge being identified.  8/15: Goal met. Patient plans to return home to follow up with outpatient services.    2. Goal (s): Patient will exhibit decreased depressive symptoms and suicidal ideations.   Met: Adequate for discharge per MD   Target date: 3-5 days post admission  date   As evidenced by: Patient will utilize self rating of depression at 3 or below and demonstrate decreased signs of depression or be deemed stable for discharge by MD.  8/15: Goal progressing.  8/17: Adequate for discharge per MD. Patient reports improvement in his symptoms, denies SI.     3. Goal(s): Patient will demonstrate decreased signs and symptoms of anxiety.   Met: Adequate for discharge per MD   Target date: 3-5 days post admission date   As evidenced by: Patient will utilize self rating of anxiety at 3 or below and demonstrated decreased signs of anxiety, or be deemed stable for discharge by MD  8/15: Goal progressing.   8/17: Adequate for discharge per MD. Patient reports improvements in his symptoms, denies SI.     4. Goal(s): Patient will demonstrate decreased signs of withdrawal due to substance abuse   Met: Yes   Target date: 3-5 days post admission date   As evidenced by: Patient will produce a CIWA/COWS score of 0, have stable vitals signs, and no symptoms of withdrawal  8/15: Goal met. No withdrawal symptoms reported at this time per medical chart.     Attendees: Patient:    Family:    Physician: Dr. Modesta Messing 05/22/2016 9:30 AM  Nursing: Larrie Kass., Desma Paganini, RN 05/22/2016 9:30 AM  Clinical Social Worker: Erasmo Downer Rylan Bernard, LCSW 05/22/2016 9:30 AM  Other: Peri Maris, LCSW 05/22/2016 9:30 AM  Other:  05/22/2016 9:30 AM  Other:  05/22/2016 9:30 AM  Other: Agustina Caroli, May Augustin, NP 05/22/2016 9:30 AM  Other:       Scribe for Treatment Team:  Tilden Fossa, [place]

## 2016-05-22 NOTE — [facility] Suicide Risk Assessment (Signed)
[facility]   Principal Problem: Alcohol dependence [facility](HCC) Discharge Diagnoses:  Patient Active Problem List   Diagnosis Date Noted  . Alcohol dependence (HCC) [F10.20] 05/16/2016  . MDD (major depressive disorder), single episode, severe , no psychosis (HCC) [F32.2]   . Cocaine use disorder, severe, dependence (HCC) [F14.20]   . Alcohol use disorder, severe, dependence (HCC) [F10.20]   . 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 BP (high blood pressure) [I10] 02/23/2014    Total Time spent with patient: 20 minutes  Musculoskeletal: Strength & Muscle Tone: within normal limits Gait & Station: normal Patient leans: N/A  Psychiatric Specialty Exam: Review of Systems  Musculoskeletal:       Left shoulder pain  Psychiatric/Behavioral: Negative for depression, hallucinations, substance abuse and suicidal ideas. The patient is not nervous/anxious and does not have insomnia.   All other systems reviewed and are negative.   Blood pressure 108/73, pulse 81, temperature 97.6 F (36.4 C), temperature source Oral, resp. rate 18, height 6' 2.5" (1.892 m), weight 189 lb 8 oz (86 kg), SpO2 100 %.Body mass index is 24 kg/m.  General Appearance: Fairly Groomed  Patent attorneyye Contact::  Good  Speech:  Normal Rate409  Volume:  Normal  Mood:  Anxious  Affect:  slightly constricted, improving  Thought Process:  Coherent and Goal Directed  Orientation:  Full (Time, Place, and Person)  Thought Content:  Logical No paranoia, Perceptions: denies AH/VH.   Suicidal Thoughts:  No  Homicidal Thoughts:  No  Memory:  intact  Judgement:  Good  Insight:  Good  Psychomotor Activity:  Normal  Concentration:  Good  Recall:  Good  Fund of Knowledge:Good  Language: Good  Akathisia:  No  Handed:  Right  AIMS (if indicated):     Assets:  Communication  Skills Desire for Improvement  Sleep:  Number of Hours: 6.25  Cognition: WNL  ADL's:  Intact   Mental Status Per Nursing Assessment::   On Admission:  Suicidal ideation indicated by patient  Demographic Factors:  Male and Access to firearms  Loss Factors: Decline in physical health  Historical Factors: Family history of mental illness or substance abuse Cousin: unknown diagnosis  Risk Reduction Factors:   Responsible for children under 10118 years of age, Employed and Positive social support  Continued Clinical Symptoms:  Alcohol/Substance Abuse/Dependencies  Cognitive Features That Contribute To Risk:  None    Suicide Risk:  Mild:  Suicidal ideation of limited frequency, intensity, duration, and specificity.  There are no identifiable plans, no associated intent, mild dysphoria and related symptoms, good self-control (both objective and subjective assessment), few other risk factors, and identifiable protective factors, including available and accessible social support.  Follow-up Information    [facility]-EMERGENCY DEPT.   Specialty:  Emergency Medicine Why:  As needed, If symptoms worsen Contact information: [address] [id number] [place] [id number] [phone]       [facility] Follow up on 05/23/2016.   Why:  Assessment for therapy and medication management services with Dr. Cheyenne Adasinger tomorrow Friday August 18th at 1pm. Please arrive 15 minutes early and bring insurance card and hospital discharge paperwork. Call office if you need to reschedule appt.  Contact information: [address] [place], [place] [id number] [phone]         Patient will be discharged to home. He is  future oriented, motivated for sobriety. Denies SI. Agrees for follow up appointment as above.   Plan Of Care/Follow-up recommendations:  Activity:  full Diet:  regular Tests:  none Other:  follow up as above  Neysa Hottereina Kelechi Orgeron,  MD 05/22/2016, 10:44 AM

## 2016-05-22 NOTE — Discharge Summary (Signed)
Physician Discharge Summary Note  Patient:  Terry Hill is an 60 y.o., male  MRN:  [id number]  DOB:  06/13/1956  Patient phone:  [phone] (home)   Patient address:   [address] [place] [id number],   Total Time spent with patient: Greater than 30 minutes  Date of Admission:  05/16/2016  Date of Discharge: 05-22-16  Reason for Admission: Worsening symptoms of depression with suicidal ideations with plans to overdose.  Principal Problem: Alcohol dependence [facility](HCC)  Discharge Diagnoses: Patient Active Problem List   Diagnosis Date Noted  . Alcohol dependence (HCC) [F10.20] 05/16/2016  . MDD (major depressive disorder), single episode, severe , no psychosis (HCC) [F32.2]   . Cocaine use disorder, severe, dependence (HCC) [F14.20]   . Alcohol use disorder, severe, dependence (HCC) [F10.20]   . 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 BP (high blood pressure) [I10] 02/23/2014   Past Psychiatric History: Alcoholism, chronic, MDD  Past Medical History:  Past Medical History:  Diagnosis Date  . Depression   . Gout   . Hypertension   . Suicidal ideation    History reviewed. No pertinent surgical history.  Family History: History reviewed. No pertinent family history.  Family Psychiatric  History: See H&P  Social History:  History  Alcohol Use  . Yes    Comment: daily usage (1 pint daily)     History  Drug Use  . Types: Cocaine    Social History   Social History  . Marital status: Married    Spouse name: N/A  . Number of children: N/A  . Years of education: N/A   Social History Main Topics  . Smoking status: Current Every Day Smoker    Types: Cigarettes  . Smokeless tobacco: Never Used  . Alcohol use Yes     Comment: daily usage (1 pint daily)  . Drug use:     Types: Cocaine  . Sexual activity: Not  Currently   Other Topics Concern  . None   Social History Narrative  . None   Hospital Course: Terry Hill a 60 year old African American male that reports SI with a plan to overdose or shot himself. Patient reports that if hehad a pistol it would be over by now. Patient denies having access to a gun. Patient reports that he is not able to contract for safety. Patient reports increased depression associated with taking care of his six adopted children as well as a strained relationship with his wife. Patient reports due to the increased stress of the his wife not being able to take care of the children has caused the patient to recently relapse and begin to abuse cocaine and alcohol.  Patient reports [facility]. Patient denies inpatient or outpatient substance abuse treatment or detox. Patient reports that he has a long-standing history of alcohol abuse. Over the last 2-3 months he has relapsed with his drinking and drinks as much liquor as he can daily. His last drinkwas last night.  Terry Hill was admitted to the hospital with a blood alcohol level of 37 per toxicology tests reports & UDS test reports was positive for cocaine. He was also complaining of mood instability/high anxiety levels as he believed were triggered by familial stressors of raising 6 adopted children. He stated that he relapsed on alcohol & cocaine just to cope.  Terry Hill has hx of chronic alcoholism & has been hospitalized in this hospital in the past for alcohol detoxification treatments. He denies any substance abuse treatment history outside the hospital. He was in need of alcohol detoxification as well as mood stabilization treatments. He was also presented with other chronic medical conditions (pain, HTN), that required treatments. His detoxification treatments was achieved using [facility] detox protocols.  Besides the detoxification treatments, Terry Hill was also  medicated & discharged on; Fluoxetine 20 mg for depression, Hydroxyzine 50 mg for anxiety & nicorette gum for nicotine addiction. He was resumed on all his pertinent home medications for his other pre-existing medical issues that he presented. He tolerated his treatment regimen without any significant adverse effects & or reactions reported. Terry Hill was also enrolled and participated in the group counseling sessions & AA/NA meetings being offered and held on this unit. He learned coping skills that should help him cope better after discharge to maintain sobriety & mood stability.  Terry Hill was motivated for recovery during his course of treatment. He worked closely with the treatment team and case managers to develop a discharge plan with appropriate goals to maintain mood stability/sobriety. Coping skills, problem solving as well as relaxation therapies were also part of the unit programming.   Terry Hill has completed detox treatment & his mood is also stable. This is evidenced by his reports of improved mood, absence of suicidal ideations & or substance withdrawals symptoms. He is currently being discharged to continue mental health care & substance abuse treatment as noted below. Upon hospital discharge, Terry Hill was in much improved condition than upon admission. His symptoms were reported as significantly decreased or resolved completely. He currently denies any SIHI, AVH, delusional thoughts & or paranoia. He was motivated to continue taking medications with a goal of continued improvement in mental health.  He will follow-up care as noted below. He was provided with all the pertinent information required to make this appointment without problems. [facility] with [facility] personal belongings in no apparent distress. Transportation per self.  Physical Findings: AIMS: Facial and Oral Movements Muscles of Facial Expression: None, normal Lips and Perioral Area: None, normal Jaw: None, normal Tongue: None,  normal,Extremity Movements Upper (arms, wrists, hands, fingers): None, normal Lower (legs, knees, ankles, toes): None, normal, Trunk Movements Neck, shoulders, hips: None, normal, Overall Severity Severity of abnormal movements (highest score from questions above): None, normal Incapacitation due to abnormal movements: None, normal Patient's awareness of abnormal movements (rate only patient's report): No Awareness, Dental Status Current problems with teeth and/or dentures?: No Does patient usually wear dentures?: No  CIWA:  CIWA-Ar Total: 0 COWS:     Musculoskeletal: Strength & Muscle Tone: within normal limits Gait & Station: normal Patient leans: N/A  Psychiatric Specialty Exam: Physical Exam  Constitutional: He appears well-developed.  HENT:  Head: Normocephalic.  Eyes: Pupils are equal, round, and reactive to light.  Neck: Normal range of motion.  Cardiovascular: Normal rate.   Respiratory: Effort normal.  GI: Soft.  Genitourinary:  Genitourinary Comments: Denies any issues in this area  Musculoskeletal: Normal range of motion.  Neurological: He is alert.  Skin: Skin is warm.    Review of Systems  Constitutional: Negative.   HENT: Negative.   Eyes: Negative.   Respiratory: Negative.   Cardiovascular: Negative.   Gastrointestinal: Negative.   Genitourinary: Negative.   Musculoskeletal: Negative.   Skin: Negative.   Neurological: Negative.   Endo/Heme/Allergies: Negative.   Psychiatric/Behavioral: Positive for depression (Stable) and substance abuse (  Hx. Alcoholism, cocaine dependence). Negative for hallucinations, memory loss and suicidal ideas. The patient has insomnia (Stable). The patient is not nervous/anxious.     Blood pressure 108/73, pulse 81, temperature 97.6 F (36.4 C), temperature source Oral, resp. rate 18, height 6' 2.5" (1.892 m), weight 86 kg (189 lb 8 oz), SpO2 100 %.Body mass index is 24 kg/m.  See Md's SRA   Have you used any form of tobacco  in the last 30 days? (Cigarettes, Smokeless Tobacco, Cigars, and/or Pipes): Yes  Has this patient used any form of tobacco in the last 30 days? (Cigarettes, Smokeless Tobacco, Cigars, and/or Pipes):Yes, Nicorette gum prescription provided.  Blood Alcohol level:  Lab Results  Component Value Date   ETH 37 (H) 05/16/2016   ETH 51 (H) 09/08/2014   Metabolic Disorder Labs:  No results found for: HGBA1C, MPG No results found for: PROLACTIN No results found for: CHOL, TRIG, HDL, CHOLHDL, VLDL, LDLCALC  See Psychiatric Specialty Exam and Suicide Risk Assessment completed by Attending Physician prior to discharge.  Discharge destination:  Home  Is patient on multiple antipsychotic therapies at discharge:  No   Has Patient had three or more failed trials of antipsychotic monotherapy by history:  No  Recommended Plan for Multiple Antipsychotic Therapies: NA    Medication List    STOP taking these medications   tadalafil 5 MG tablet Commonly known as:  CIALIS   traZODone 100 MG tablet Commonly known as:  DESYREL     TAKE these medications     Indication  amLODipine 2.5 MG tablet Commonly known as:  NORVASC Take 1 tablet (2.5 mg total) by mouth daily with breakfast. For high blood pressure What changed:  medication strength  how much to take  additional instructions  Indication:  High Blood Pressure Disorder   cyclobenzaprine 10 MG tablet Commonly known as:  FLEXERIL Take 1 tablet (10 mg total) by mouth 2 (two) times daily as needed for muscle spasms.    FLUoxetine 20 MG capsule Commonly known as:  PROZAC Take 1 capsule (20 mg total) by mouth daily. For depression What changed:  medication strength  how much to take  additional instructions  Indication:  Depression   hydrocortisone cream 1 % Apply 1 application topically as needed for itching.  Indication:  Itching   hydrOXYzine 50 MG tablet Commonly known as:  ATARAX/VISTARIL Take 1 tablet (50 mg total) by  mouth at bedtime as needed (insomnia).  Indication:  Insomnia   lidocaine 5 % Commonly known as:  LIDODERM Place 1 patch onto the skin daily. Remove & Discard patch within 12 hours or as directed by MD: For pain management  Indication:  Pain management   naproxen 500 MG tablet Commonly known as:  NAPROSYN Take 1 tablet (500 mg total) by mouth 2 (two) times daily. For mild to moderate pain  Indication:  Mild to Moderate Pain   nicotine polacrilex 2 MG gum Commonly known as:  NICORETTE Take 1 each (2 mg total) by mouth as needed for smoking cessation.  Indication:  Nicotine Addiction   olmesartan-hydrochlorothiazide 40-25 MG tablet Commonly known as:  BENICAR HCT Take 1 tablet by mouth daily with breakfast. For high blood pressure What changed:  additional instructions  Indication:  High Blood Pressure Disorder      Follow-up Information    [facility]   Specialty:  Emergency Medicine Why:  As needed, If symptoms worsen Contact information: [id number] [place]  [place] [address] [phone]       [facility] Follow up on 05/23/2016.   Why:  Assessment for therapy and medication management services with Dr. Cheyenne Adas tomorrow Friday August 18th at 1pm. Please arrive 15 minutes early and bring insurance card and hospital discharge paperwork. Call office if you need to reschedule appt.  Contact information: [address] [place], [place] [address] [phone]         Follow-up recommendations:  Activity:  As tolerated Diet: As recommended by your primary care doctor. Keep all scheduled follow-up appointments as recommended.  Comments:  Patient is instructed prior to discharge to: Take all medications as prescribed by his/her mental healthcare provider. Report any adverse effects and or reactions from the medicines to his/her outpatient provider promptly. Patient has been instructed & cautioned: To  not engage in alcohol and or illegal drug use while on prescription medicines. In the event of worsening symptoms, patient is instructed to call the crisis hotline, 911 and or go to the nearest ED for appropriate evaluation and treatment of symptoms. To follow-up with his/her primary care provider for your other medical issues, concerns and or health care needs.   Signed: Sanjuana Kava, NP, PMHNP, FNP-BC 05/22/2016, 10:47 AM

## 2016-05-22 NOTE — Progress Notes (Signed)
[facility]. All items returned. D/C instructions given and prescriptions given. Pt denies si and hi.

## 2016-05-22 NOTE — Progress Notes (Signed)
  [facility] Adult Case Management Discharge Plan :  Will you be returning to the same living situation after discharge:  Yes,  patient plans to return home At discharge, do you have transportation home?: Yes,  patient's car is at hospital  Do you have the ability to pay for your medications: Yes,  patient will be provided with presciptions at discharge  Release of information consent forms completed and in the chart;  Patient's signature needed at discharge.  Patient to Follow up at: Follow-up Information    [facility]   Specialty:  Emergency Medicine Why:  As needed, If symptoms worsen Contact information: [id number] [place] [id number] [phone]       [facility] Follow up on 05/23/2016.   Why:  Assessment for therapy and medication management services with Dr. Cheyenne Adasinger tomorrow Friday August 18th at 1pm. Please arrive 15 minutes early and bring insurance card and hospital discharge paperwork. Call office if you need to reschedule appt.  Contact information: [address] [place], [place] [address] [phone]          Next level of care provider has access to [facility] Link:no  Safety Planning and Suicide Prevention discussed: Yes,  with patient   Have you used any form of tobacco in the last 30 days? (Cigarettes, Smokeless Tobacco, Cigars, and/or Pipes): Yes  Has patient been referred to the Quitline?: Yes, faxed on 05/22/16  Patient has been referred for addiction treatment: Yes  Terry Hill 05/22/2016, 10:12 AM

## 2018-10-06 ENCOUNTER — Encounter (HOSPITAL_COMMUNITY): Payer: Self-pay | Admitting: Emergency Medicine

## 2018-10-06 ENCOUNTER — Emergency Department (HOSPITAL_COMMUNITY): Payer: 59

## 2018-10-06 ENCOUNTER — Emergency Department (HOSPITAL_COMMUNITY)
Admission: EM | Admit: 2018-10-06 | Discharge: 2018-10-06 | Disposition: A | Discharge status: Home / Self Care | Payer: 59 | Attending: Emergency Medicine | Admitting: Emergency Medicine

## 2018-10-06 DIAGNOSIS — I10 Essential (primary) hypertension: Secondary | ICD-10-CM | POA: Diagnosis not present

## 2018-10-06 DIAGNOSIS — M7022 Olecranon bursitis, left elbow: Secondary | ICD-10-CM | POA: Insufficient documentation

## 2018-10-06 DIAGNOSIS — Y939 Activity, unspecified: Secondary | ICD-10-CM | POA: Insufficient documentation

## 2018-10-06 DIAGNOSIS — F17210 Nicotine dependence, cigarettes, uncomplicated: Secondary | ICD-10-CM | POA: Insufficient documentation

## 2018-10-06 DIAGNOSIS — Z79899 Other long term (current) drug therapy: Secondary | ICD-10-CM | POA: Diagnosis not present

## 2018-10-06 DIAGNOSIS — M25522 Pain in left elbow: Secondary | ICD-10-CM | POA: Diagnosis present

## 2018-10-06 MED ORDER — OXYCODONE HCL 5 MG PO TABS: 5.0000 mg | ORAL_TABLET | ORAL | 0 refills | Status: AC | PRN

## 2018-10-06 NOTE — ED Triage Notes (Signed)
Pt reports bumping his L elbow 2 weeks ago, noticed swelling and pain that began 1 week ago, worse over the past couple of days. Denies fevers or chills.

## 2018-10-06 NOTE — ED Provider Notes (Signed)
[facility] EMERGENCY DEPARTMENT Provider Note   CSN: [id number] Arrival date & time: 10/06/18  1118     History   Chief Complaint Chief Complaint  Patient presents with  . Joint Swelling    HPI Terry Hill is a 63 y.o. male.  Patient is a 63 year old male who presents with left elbow pain.  He states he bumped his elbow about 2 weeks ago.  It was a little sore but over the last 3 to 4 days he has had increased swelling over the elbow.  It hurts to move it.  He has a little bit of tingling in his fingers.  No weakness in the hand.  No fevers.  Of note he is on Eliquis for recently diagnosed portal vein thrombosis.     Past Medical History:  Diagnosis Date  . Depression   . Gout   . Hypertension   . Suicidal ideation     Patient Active Problem List   Diagnosis Date Noted  . Alcohol dependence (HCC) 05/16/2016  . MDD (major depressive disorder), single episode, severe , no psychosis (HCC)   . Cocaine use disorder, severe, dependence (HCC)   . Alcohol use disorder, severe, dependence (HCC)   . Major depressive disorder without psychotic features 09/08/2014    Class: Acute  . Severe major depression without psychotic features (HCC) 09/08/2014  . BP (high blood pressure) 02/23/2014    History reviewed. No pertinent surgical history.      Home Medications    Prior to Admission medications   Medication Sig Start Date End Date Taking? Authorizing Provider  amLODipine (NORVASC) 2.5 MG tablet Take 1 tablet (2.5 mg total) by mouth daily with breakfast. For high blood pressure 05/22/16   Nwoko, Nicole Kindred I, NP  cyclobenzaprine (FLEXERIL) 10 MG tablet Take 1 tablet (10 mg total) by mouth 2 (two) times daily as needed for muscle spasms. 05/19/16   Geoffery Lyons, MD  FLUoxetine (PROZAC) 20 MG capsule Take 1 capsule (20 mg total) by mouth daily. For depression 05/22/16   Armandina Stammer I, NP  hydrocortisone cream 1 % Apply 1 application topically as needed for  itching. 05/22/16   Armandina Stammer I, NP  hydrOXYzine (ATARAX/VISTARIL) 50 MG tablet Take 1 tablet (50 mg total) by mouth at bedtime as needed (insomnia). 05/22/16   Armandina Stammer I, NP  lidocaine (LIDODERM) 5 % Place 1 patch onto the skin daily. Remove & Discard patch within 12 hours or as directed by MD: For pain management 05/22/16   Armandina Stammer I, NP  naproxen (NAPROSYN) 500 MG tablet Take 1 tablet (500 mg total) by mouth 2 (two) times daily. For mild to moderate pain 05/22/16   Armandina Stammer I, NP  nicotine polacrilex (NICORETTE) 2 MG gum Take 1 each (2 mg total) by mouth as needed for smoking cessation. 05/22/16   Armandina Stammer I, NP  olmesartan-hydrochlorothiazide (BENICAR HCT) 40-25 MG tablet Take 1 tablet by mouth daily with breakfast. For high blood pressure 05/22/16   Nwoko, Nicole Kindred I, NP  oxyCODONE (ROXICODONE) 5 MG immediate release tablet Take 1 tablet (5 mg total) by mouth every 4 (four) hours as needed for severe pain. 10/06/18   Rolan Bucco, MD    Family History No family history on file.  Social History Social History   Tobacco Use  . Smoking status: Current Every Day Smoker    Types: Cigarettes  . Smokeless tobacco: Never Used  Substance Use Topics  . Alcohol use: Yes  Comment: daily usage (1 pint daily)  . Drug use: Yes    Types: Cocaine     Allergies   Sulfa antibiotics and Sulfur   Review of Systems Review of Systems  Constitutional: Negative for fever.  Gastrointestinal: Negative for nausea and vomiting.  Musculoskeletal: Positive for arthralgias and joint swelling. Negative for back pain and neck pain.  Skin: Negative for wound.  Neurological: Negative for weakness, numbness and headaches.     Physical Exam Updated Vital Signs BP (!) 156/104   Pulse 82   Temp 97.9 F (36.6 C) (Oral)   Resp 15   SpO2 100%   Physical Exam Constitutional:      Appearance: He is well-developed.  HENT:     Head: Normocephalic and atraumatic.  Neck:      Musculoskeletal: Normal range of motion and neck supple.  Cardiovascular:     Rate and Rhythm: Normal rate.  Pulmonary:     Effort: Pulmonary effort is normal.  Musculoskeletal:        General: Tenderness present.     Comments: Patient has swelling over the left olecranon bursa.  There is no warmth or erythema.  No wounds are noted.  No swelling of the elbow joint.  He has full extension of the arm without significant pain although he has pain on flexion at the elbow joint.  There is no pain with supination or pronation.  He has normal sensation in all during distributions of the hand.  He has normal grip strength and flexion extension of the wrist.  Radial pulses are intact.   Skin:    General: Skin is warm and dry.  Neurological:     Mental Status: He is alert and oriented to person, place, and time.      ED Treatments / Results  Labs (all labs ordered are listed, but only abnormal results are displayed) Labs Reviewed - No data to display  EKG None  Radiology Dg Elbow Complete Left  Result Date: 10/06/2018 CLINICAL DATA:  Pt reports bumping his L elbow 2 weeks ago, noticed swelling and pain that began 1 week ago, worse over the past couple of days. EXAM: LEFT ELBOW - COMPLETE 3+ VIEW COMPARISON:  None. FINDINGS: There is no evidence of fracture, dislocation, or joint effusion. There is no evidence of arthropathy or other focal bone abnormality. There is dorsal soft tissue swelling. IMPRESSION: No acute fracture or dislocation. Dorsal soft tissue swelling. Electronically Signed   By: Corlis Leak  Hassell M.D.   On: 10/06/2018 12:35    Procedures Procedures (including critical care time)  Medications Ordered in ED Medications - No data to display   Initial Impression / Assessment and Plan / ED Course  I have reviewed the triage vital signs and the nursing notes.  Pertinent labs & imaging results that were available during my care of the patient were reviewed by me and considered in my  medical decision making (see chart for details).     X-rays show no bony injury.  He has evidence of olecranon bursitis.  I feel this is likely inflammatory versus infectious.  There is no significant warmth or erythema.  There is no evidence of joint effusion.  An Ace wrap was provided for compression.  He was advised in icing and resting the area with protection of the bursa.  He has limited choices for pain control.  He states he is unable to take Tylenol due to his liver disease.  He is unable to take NSAIDs  due to his Eliquis.  I did give him a very short course of oxycodone and advised him to use ice as well for symptomatic therapy.  He has a follow-up appointment with his primary care physician in 1 week.  Return precautions were given.  Final Clinical Impressions(s) / ED Diagnoses   Final diagnoses:  Olecranon bursitis of left elbow    ED Discharge Orders         Ordered    oxyCODONE (ROXICODONE) 5 MG immediate release tablet  Every 4 hours PRN     10/06/18 1246           Rolan Bucco, MD 10/06/18 1253

## 2018-10-06 NOTE — ED Notes (Signed)
Patient transported to X-ray 

## 2020-04-25 IMAGING — DX DG ELBOW COMPLETE 3+V*L*
4 series · 4 of 4 positions shown · non-contrast
Comparison: None.

CLINICAL DATA: Pt reports bumping his L elbow 2 weeks ago, noticed
swelling and pain that began 1 week ago, worse over the past couple
of days.

EXAM:
LEFT ELBOW - COMPLETE 3+ VIEW

[x elbow obl left (1 of 2)]
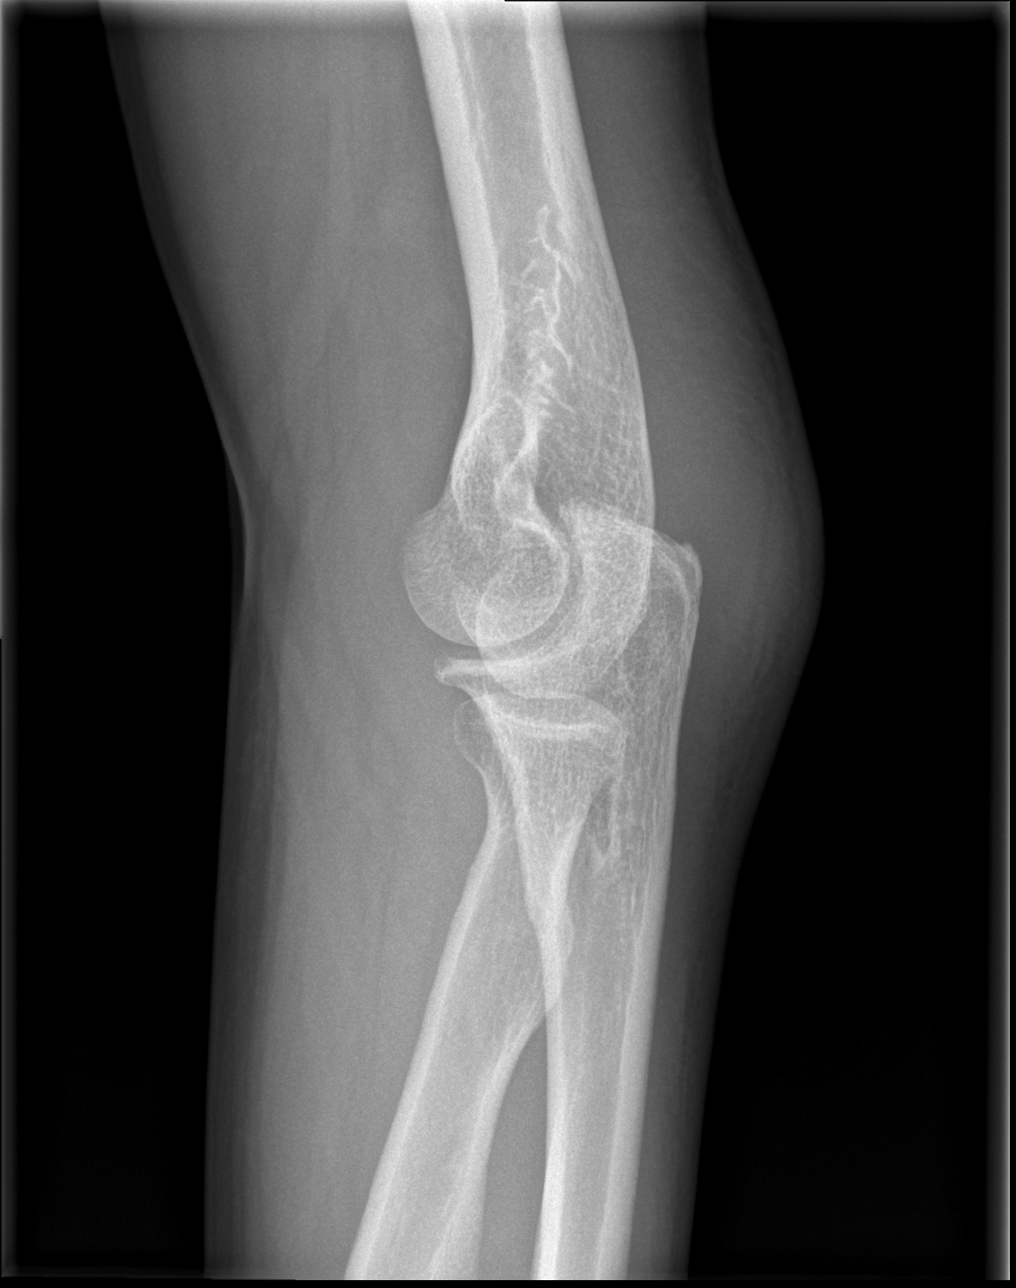

[x elbow obl left (2 of 2)]
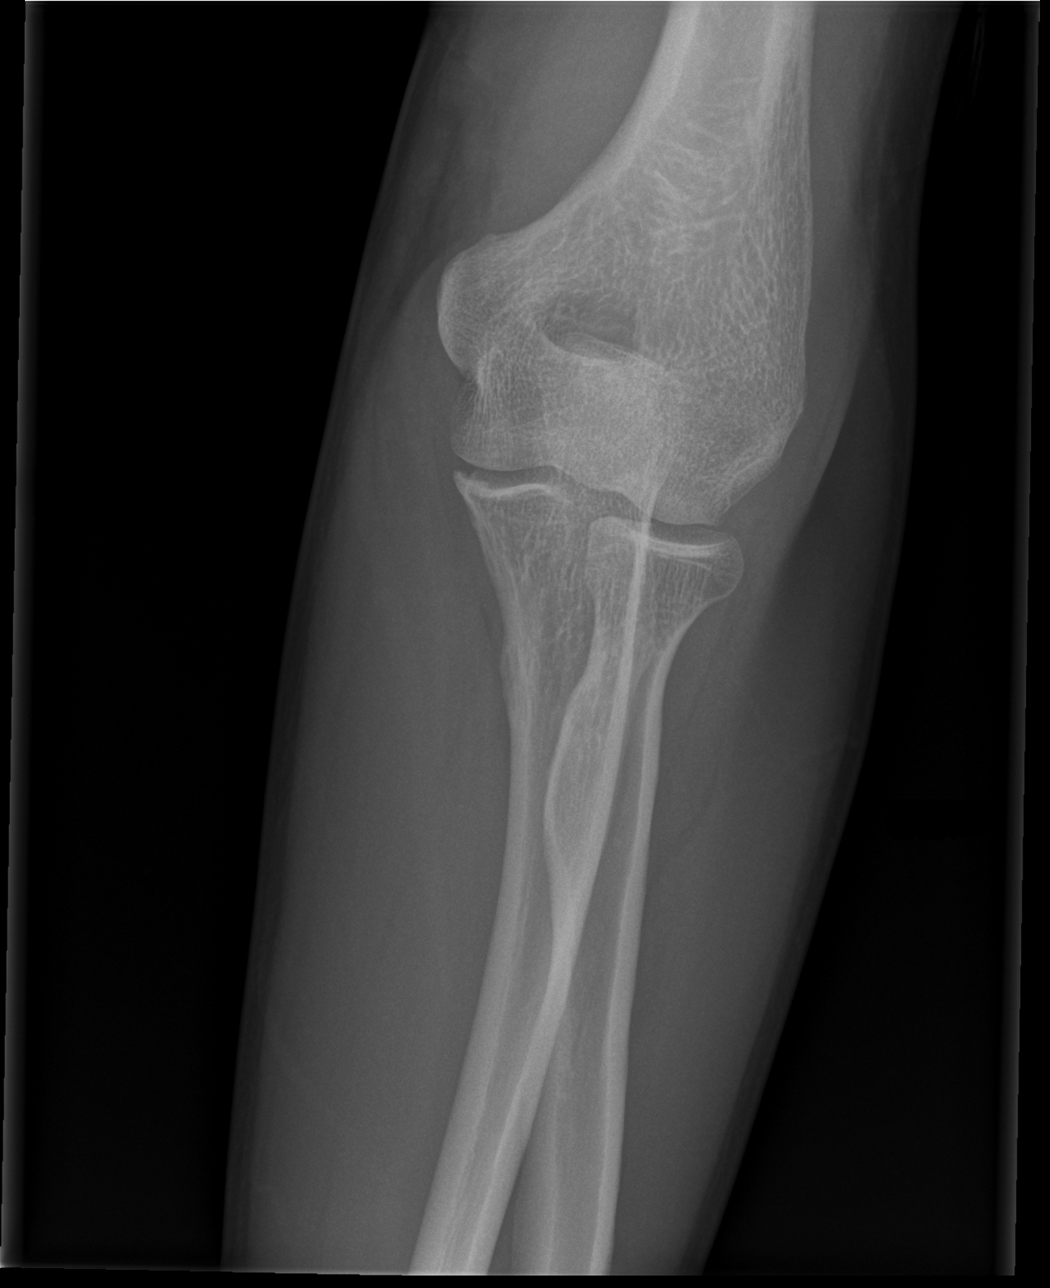

[x elbow ap left]
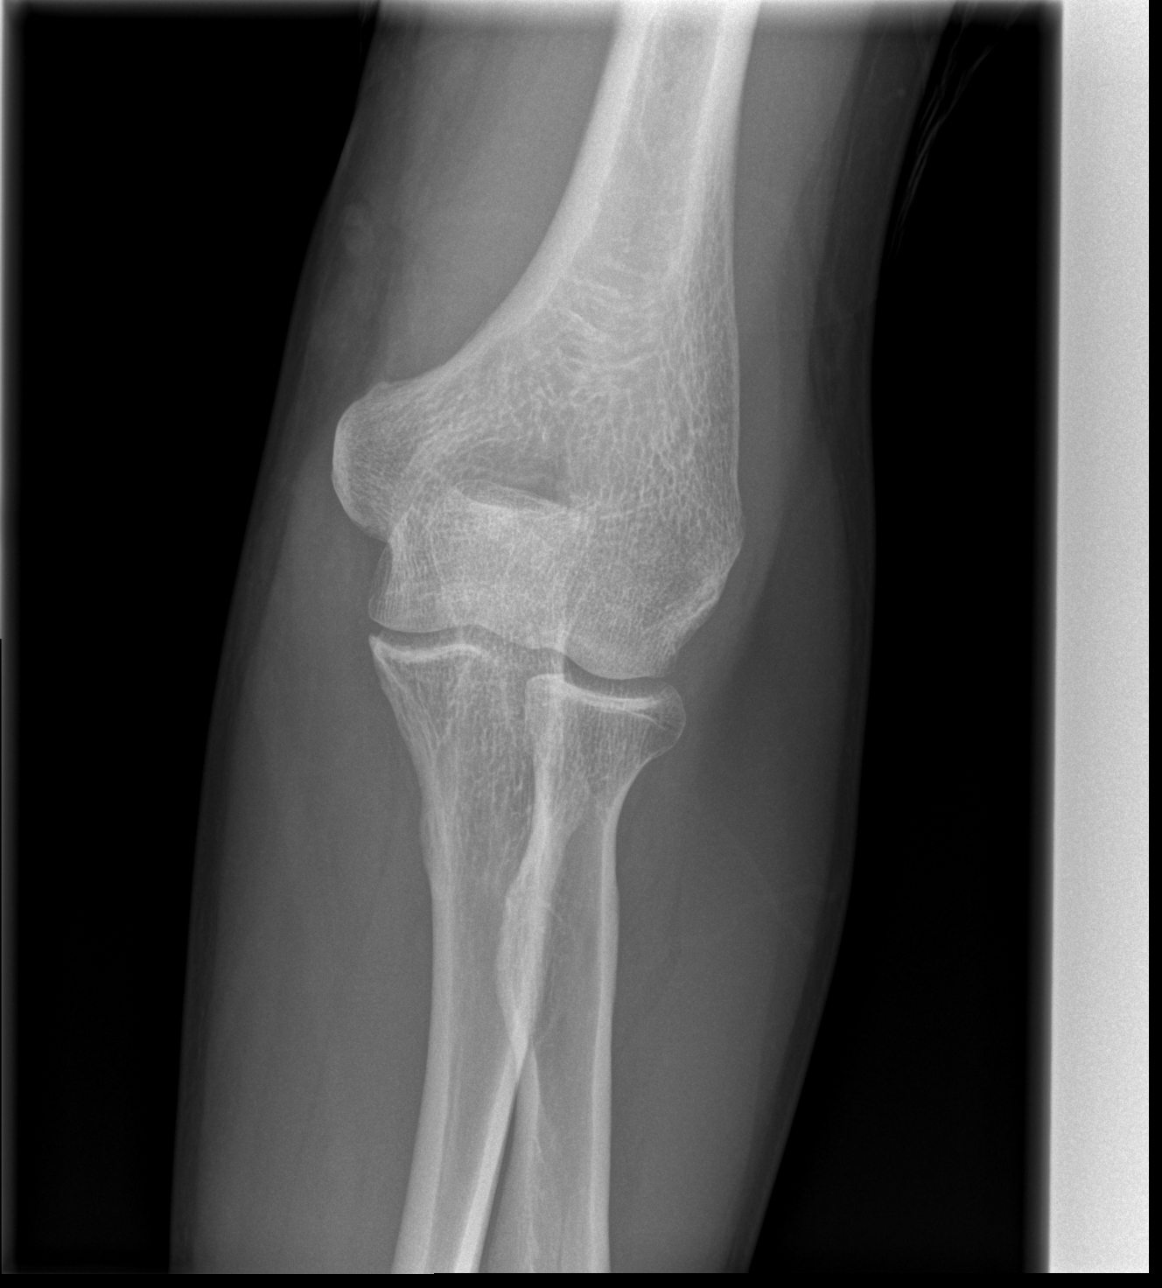

[x elbow lat left]
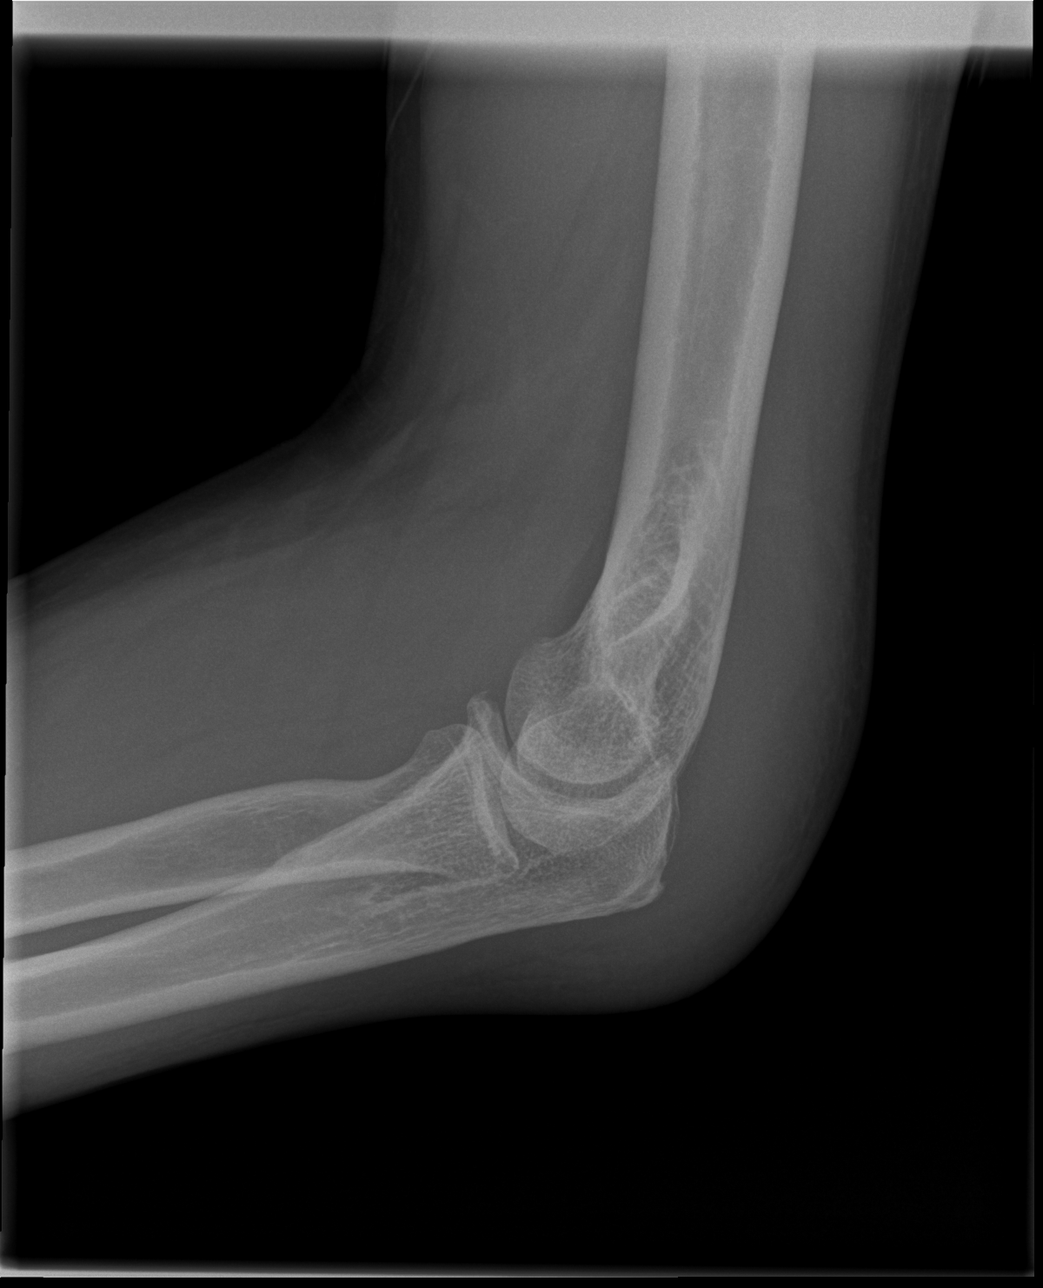

[4 of 4 positions shown; findings below may reference images not displayed]

FINDINGS: There is no evidence of fracture, dislocation, or joint effusion.
There is no evidence of arthropathy or other focal bone abnormality.
There is dorsal soft tissue swelling.
IMPRESSION: No acute fracture or dislocation. Dorsal soft tissue swelling.

## 2021-03-06 ENCOUNTER — Encounter (HOSPITAL_COMMUNITY): Payer: Self-pay | Admitting: Emergency Medicine

## 2021-03-06 ENCOUNTER — Ambulatory Visit (HOSPITAL_COMMUNITY)
Admission: EM | Admit: 2021-03-06 | Discharge: 2021-03-06 | Disposition: A | Discharge status: Home / Self Care | Payer: 59 | Attending: Emergency Medicine | Admitting: Emergency Medicine

## 2021-03-06 ENCOUNTER — Other Ambulatory Visit: Payer: Self-pay

## 2021-03-06 DIAGNOSIS — Z882 Allergy status to sulfonamides status: Secondary | ICD-10-CM | POA: Insufficient documentation

## 2021-03-06 DIAGNOSIS — R051 Acute cough: Secondary | ICD-10-CM | POA: Diagnosis not present

## 2021-03-06 DIAGNOSIS — Z760 Encounter for issue of repeat prescription: Secondary | ICD-10-CM | POA: Insufficient documentation

## 2021-03-06 DIAGNOSIS — Z20822 Contact with and (suspected) exposure to covid-19: Secondary | ICD-10-CM | POA: Insufficient documentation

## 2021-03-06 DIAGNOSIS — Z79899 Other long term (current) drug therapy: Secondary | ICD-10-CM | POA: Diagnosis not present

## 2021-03-06 DIAGNOSIS — F17210 Nicotine dependence, cigarettes, uncomplicated: Secondary | ICD-10-CM | POA: Insufficient documentation

## 2021-03-06 DIAGNOSIS — I10 Essential (primary) hypertension: Secondary | ICD-10-CM | POA: Insufficient documentation

## 2021-03-06 DIAGNOSIS — N400 Enlarged prostate without lower urinary tract symptoms: Secondary | ICD-10-CM | POA: Diagnosis not present

## 2021-03-06 DIAGNOSIS — R0602 Shortness of breath: Secondary | ICD-10-CM | POA: Insufficient documentation

## 2021-03-06 DIAGNOSIS — R059 Cough, unspecified: Secondary | ICD-10-CM

## 2021-03-06 LAB — POC INFLUENZA A AND B ANTIGEN (URGENT CARE ONLY)
INFLUENZA A ANTIGEN, POC: NEGATIVE
INFLUENZA B ANTIGEN, POC: NEGATIVE

## 2021-03-06 MED ORDER — ALBUTEROL SULFATE HFA 108 (90 BASE) MCG/ACT IN AERS: 2.0000 | INHALATION_SPRAY | RESPIRATORY_TRACT | 0 refills | Status: DC | PRN

## 2021-03-06 MED ORDER — FINASTERIDE 5 MG PO TABS: 5.0000 mg | ORAL_TABLET | Freq: Every day | ORAL | 1 refills | Status: AC

## 2021-03-06 MED ORDER — TAMSULOSIN HCL 0.4 MG PO CAPS: 0.4000 mg | ORAL_CAPSULE | Freq: Every day | ORAL | 1 refills | Status: DC

## 2021-03-06 MED ORDER — AMLODIPINE BESYLATE 5 MG PO TABS: 10.0000 mg | ORAL_TABLET | Freq: Every day | ORAL | 1 refills | Status: AC

## 2021-03-06 MED ORDER — BENZONATATE 100 MG PO CAPS: 100.0000 mg | ORAL_CAPSULE | Freq: Three times a day (TID) | ORAL | 0 refills | Status: DC

## 2021-03-06 NOTE — ED Triage Notes (Signed)
Shortness of breath and cough for 2 days. Needs refill of amlodipine 10 mg - has not run out yet  Wants refill of finasteride 5 mg and tamsulosin 0.4 mg - he has been out of these for over 1 month

## 2021-03-06 NOTE — Discharge Instructions (Addendum)
Can use tessalon pill three times a day for cough, may also attempt teaspoon of honey, humidifier to moisten air, cough drops   Can use 2 puffs of inhaler every 4 hours as needed for shortness of breath  COVID test pending 24 hours, flu test pending, you will be called if positive  You were given two month supply of medication refills  Track blood pressure daily around the same time, you can show this to your new doctor to help with evaluation of effectiveness of medication

## 2021-03-06 NOTE — ED Provider Notes (Signed)
[facility]    CSN: [id number] Arrival date & time: 03/06/21  1745      History   Chief Complaint Chief Complaint  Patient presents with  . Shortness of Breath  . Medication Refill    HPI Terry Hill is a 65 y.o. male.   Patient presents with shortness of breath, productive cough, sore throat and body aches beginning yesterday afternoon. Able to tolerate food and liquids. Has not attempted treatment. Denies fever, chills, headache, chest pain, abdominal pain, N/V/D, ear pain, sinus pressure.   Requesting refill on blood pressure and BPH medication. Attempting to change PCP. Takes blood pressure medication daily. Has not taken BPH medication in one month, ran out.   Past Medical History:  Diagnosis Date  . Depression   . Gout   . Hypertension   . Suicidal ideation     Patient Active Problem List   Diagnosis Date Noted  . Alcohol dependence (HCC) 05/16/2016  . MDD (major depressive disorder), single episode, severe , no psychosis (HCC)   . Cocaine use disorder, severe, dependence (HCC)   . Alcohol use disorder, severe, dependence (HCC)   . Major depressive disorder without psychotic features 09/08/2014    Class: Acute  . Severe major depression without psychotic features (HCC) 09/08/2014  . BP (high blood pressure) 02/23/2014    History reviewed. No pertinent surgical history.     Home Medications    Prior to Admission medications   Medication Sig Start Date End Date Taking? Authorizing Provider  albuterol (VENTOLIN HFA) 108 (90 Base) MCG/ACT inhaler Inhale 2 puffs into the lungs every 4 (four) hours as needed for wheezing or shortness of breath. 03/06/21  Yes Layza Summa R, NP  amLODipine (NORVASC) 5 MG tablet Take 2 tablets (10 mg total) by mouth daily. 03/06/21  Yes Maecie Sevcik R, NP  benzonatate (TESSALON) 100 MG capsule Take 1 capsule (100 mg total) by mouth every 8 (eight) hours. 03/06/21  Yes Makinlee Awwad R, NP  finasteride (PROSCAR)  5 MG tablet Take 1 tablet (5 mg total) by mouth daily. 03/06/21  Yes Hayde Kilgour R, NP  tamsulosin (FLOMAX) 0.4 MG CAPS capsule Take 1 capsule (0.4 mg total) by mouth daily after supper. 03/06/21  Yes Kelly Ranieri, Elita Boone, NP  cyclobenzaprine (FLEXERIL) 10 MG tablet Take 1 tablet (10 mg total) by mouth 2 (two) times daily as needed for muscle spasms. 05/19/16   Geoffery Lyons, MD  FLUoxetine (PROZAC) 20 MG capsule Take 1 capsule (20 mg total) by mouth daily. For depression 05/22/16   Armandina Stammer I, NP  hydrocortisone cream 1 % Apply 1 application topically as needed for itching. 05/22/16   Armandina Stammer I, NP  hydrOXYzine (ATARAX/VISTARIL) 50 MG tablet Take 1 tablet (50 mg total) by mouth at bedtime as needed (insomnia). 05/22/16   Armandina Stammer I, NP  lidocaine (LIDODERM) 5 % Place 1 patch onto the skin daily. Remove & Discard patch within 12 hours or as directed by MD: For pain management 05/22/16   Armandina Stammer I, NP  naproxen (NAPROSYN) 500 MG tablet Take 1 tablet (500 mg total) by mouth 2 (two) times daily. For mild to moderate pain 05/22/16   Armandina Stammer I, NP  nicotine polacrilex (NICORETTE) 2 MG gum Take 1 each (2 mg total) by mouth as needed for smoking cessation. 05/22/16   Armandina Stammer I, NP  olmesartan-hydrochlorothiazide (BENICAR HCT) 40-25 MG tablet Take 1 tablet by mouth daily with breakfast. For high blood pressure 05/22/16  Armandina Stammer I, NP  oxyCODONE (ROXICODONE) 5 MG immediate release tablet Take 1 tablet (5 mg total) by mouth every 4 (four) hours as needed for severe pain. 10/06/18   Rolan Bucco, MD    Family History No family history on file.  Social History Social History   Tobacco Use  . Smoking status: Current Every Day Smoker    Types: Cigarettes  . Smokeless tobacco: Never Used  Substance Use Topics  . Alcohol use: Yes    Comment: daily usage (1 pint daily)  . Drug use: Yes    Types: Cocaine     Allergies   Elemental sulfur and Sulfa antibiotics   Review of  Systems Review of Systems  Defer to HPI    Physical Exam Triage Vital Signs ED Triage Vitals  Enc Vitals Group     BP 03/06/21 1749 (!) 154/97     Pulse Rate 03/06/21 1749 96     Resp 03/06/21 1749 20     Temp 03/06/21 1749 98.4 F (36.9 C)     Temp src --      SpO2 03/06/21 1749 99 %     Weight --      Height --      Head Circumference --      Peak Flow --      Pain Score 03/06/21 1810 0     Pain Loc --      Pain Edu? --      Excl. in GC? --    No data found.  Updated Vital Signs BP (!) 154/97   Pulse 96   Temp 98.4 F (36.9 C)   Resp 20   SpO2 99%   Visual Acuity Right Eye Distance:   Left Eye Distance:   Bilateral Distance:    Right Eye Near:   Left Eye Near:    Bilateral Near:     Physical Exam Constitutional:      Appearance: He is well-developed and normal weight.  HENT:     Head: Normocephalic.     Right Ear: Tympanic membrane, ear canal and external ear normal.     Left Ear: Tympanic membrane, ear canal and external ear normal.     Nose: Congestion and rhinorrhea present.     Mouth/Throat:     Mouth: Mucous membranes are moist.     Pharynx: Posterior oropharyngeal erythema present.  Eyes:     Extraocular Movements: Extraocular movements intact.     Conjunctiva/sclera: Conjunctivae normal.     Pupils: Pupils are equal, round, and reactive to light.  Cardiovascular:     Rate and Rhythm: Normal rate and regular rhythm.     Pulses: Normal pulses.     Heart sounds: Normal heart sounds.  Pulmonary:     Effort: Pulmonary effort is normal.     Breath sounds: Normal breath sounds.  Musculoskeletal:        General: Normal range of motion.     Cervical back: Normal range of motion and neck supple.  Skin:    General: Skin is warm and dry.  Neurological:     Mental Status: He is alert and oriented to person, place, and time. Mental status is at baseline.  Psychiatric:        Mood and Affect: Mood normal.        Behavior: Behavior normal.         Thought Content: Thought content normal.        Judgment: Judgment normal.  UC Treatments / Results  Labs (all labs ordered are listed, but only abnormal results are displayed) Labs Reviewed  SARS CORONAVIRUS 2 (TAT 6-24 HRS)  POC INFLUENZA A AND B ANTIGEN (URGENT CARE ONLY)    EKG   Radiology No results found.  Procedures Procedures (including critical care time)  Medications Ordered in UC Medications - No data to display  Initial Impression / Assessment and Plan / UC Course  I have reviewed the triage vital signs and the nursing notes.  Pertinent labs & imaging results that were available during my care of the patient were reviewed by me and considered in my medical decision making (see chart for details).  Shortness of breath Cough Medication refill  1. Albuterol inhaler 2 puffs every 4 hours prn 2. Tessalon 100 mg tid  3. covid test pending 4. Flu test- negative  5. 2 month refill of amlodipine, finasteride and flomax, declined PCP referral  Final Clinical Impressions(s) / UC Diagnoses   Final diagnoses:  Shortness of breath  Cough  Medication refill     Discharge Instructions     Can use tessalon pill three times a day for cough, may also attempt teaspoon of honey, humidifier to moisten air, cough drops   Can use 2 puffs of inhaler every 4 hours as needed for shortness of breath  COVID test pending 24 hours, flu test pending, you will be called if positive  You were given two month supply of medication refills  Track blood pressure daily around the same time, you can show this to your new doctor to help with evaluation of effectiveness of medication    ED Prescriptions    Medication Sig Dispense Auth. Provider   amLODipine (NORVASC) 5 MG tablet Take 2 tablets (10 mg total) by mouth daily. 30 tablet Maie Kesinger R, NP   finasteride (PROSCAR) 5 MG tablet Take 1 tablet (5 mg total) by mouth daily. 30 tablet Kamla Skilton, Hansel Starling R, NP   tamsulosin  (FLOMAX) 0.4 MG CAPS capsule Take 1 capsule (0.4 mg total) by mouth daily after supper. 30 capsule Jahmiya Guidotti R, NP   benzonatate (TESSALON) 100 MG capsule Take 1 capsule (100 mg total) by mouth every 8 (eight) hours. 21 capsule Cyniah Gossard R, NP   albuterol (VENTOLIN HFA) 108 (90 Base) MCG/ACT inhaler Inhale 2 puffs into the lungs every 4 (four) hours as needed for wheezing or shortness of breath. 18 g Valinda Hoar, NP     PDMP not reviewed this encounter.   Valinda Hoar, [place] 03/06/21 [phone]

## 2021-03-07 LAB — SARS CORONAVIRUS 2 (TAT 6-24 HRS): SARS Coronavirus 2: NEGATIVE

## 2024-10-11 ENCOUNTER — Ambulatory Visit (HOSPITAL_COMMUNITY)
Admission: EM | Admit: 2024-10-11 | Discharge: 2024-10-11 | Disposition: A | Discharge status: Home / Self Care | Attending: Emergency Medicine | Admitting: Emergency Medicine

## 2024-10-11 ENCOUNTER — Encounter (HOSPITAL_COMMUNITY): Payer: Self-pay

## 2024-10-11 DIAGNOSIS — R110 Nausea: Secondary | ICD-10-CM

## 2024-10-11 DIAGNOSIS — I10 Essential (primary) hypertension: Secondary | ICD-10-CM | POA: Diagnosis not present

## 2024-10-11 DIAGNOSIS — J069 Acute upper respiratory infection, unspecified: Secondary | ICD-10-CM | POA: Diagnosis not present

## 2024-10-11 LAB — POCT INFLUENZA A/B
Influenza A, POC: NEGATIVE
Influenza B, POC: NEGATIVE

## 2024-10-11 MED ORDER — OLMESARTAN MEDOXOMIL-HCTZ 40-25 MG PO TABS: 1.0000 | ORAL_TABLET | Freq: Every day | ORAL | 0 refills | Status: AC

## 2024-10-11 MED ORDER — ALBUTEROL SULFATE HFA 108 (90 BASE) MCG/ACT IN AERS: 1.0000 | INHALATION_SPRAY | Freq: Four times a day (QID) | RESPIRATORY_TRACT | 0 refills | Status: AC | PRN

## 2024-10-11 MED ORDER — BENZONATATE 100 MG PO CAPS: 100.0000 mg | ORAL_CAPSULE | Freq: Three times a day (TID) | ORAL | 0 refills | Status: AC

## 2024-10-11 MED ORDER — TAMSULOSIN HCL 0.4 MG PO CAPS: 0.4000 mg | ORAL_CAPSULE | Freq: Every day | ORAL | 0 refills | Status: AC

## 2024-10-11 NOTE — ED Notes (Signed)
 Pt wants to make his on pcp appt gave him phone number and name for [facility].

## 2024-10-11 NOTE — ED Provider Notes (Signed)
 " [facility]    CSN: [id number] Arrival date & time: 10/11/24  8177      History   Chief Complaint Chief Complaint  Patient presents with   Nausea   Cough   Generalized Body Aches   Diarrhea   Headache    HPI Terry Hill is a 69 y.o. male.   Patient presents to clinic over concern of diarrhea, nausea, cough, body aches and headache that started 2 days ago.  Has had multiple sick contacts at work.  Has had nausea but no emesis.  A little bit of diarrhea.  Does endorse wheezing and shortness of breath.  Feels like the cough that is worse at night.  Has not had any fevers and has not had abdominal pain.  Reports he has been several weeks off of his antihypertensive medication and refuses to go back to [facility].  He would like his Flomax  refilled as well for his prostate.  Is currently seeking a new PCP.   The history is provided by the patient and medical records.  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 No pertinent surgical history.     Home Medications    Prior to Admission medications  Medication Sig Start Date End Date Taking? Authorizing Provider  albuterol  (VENTOLIN  HFA) 108 (90 Base) MCG/ACT inhaler Inhale 1-2 puffs into the lungs every 6 (six) hours as needed for wheezing or shortness of breath. 10/11/24  Yes Leiloni Smithers  N, FNP  benzonatate  (TESSALON ) 100 MG  capsule Take 1 capsule (100 mg total) by mouth every 8 (eight) hours. 10/11/24  Yes Devere Brem  N, FNP  amLODipine  (NORVASC ) 5 MG tablet Take 2 tablets (10 mg total) by mouth daily. 03/06/21   White, Shelba SAUNDERS, NP  cyclobenzaprine  (FLEXERIL ) 10 MG tablet Take 1 tablet (10 mg total) by mouth 2 (two) times daily as needed for muscle spasms. Patient not taking: Reported on 10/11/2024 05/19/16   Geroldine Berg, MD  finasteride  (PROSCAR ) 5 MG tablet Take 1 tablet (5 mg total) by mouth daily. 03/06/21   White, Shelba SAUNDERS, NP  FLUoxetine  (PROZAC ) 20 MG capsule Take 1 capsule (20 mg total) by mouth daily. For depression Patient not taking: Reported on 10/11/2024 05/22/16   Collene Gouge I, NP  hydrocortisone  cream 1 % Apply 1 application topically as needed for itching. Patient not taking: Reported on 10/11/2024 05/22/16   Collene Gouge I, NP  hydrOXYzine  (ATARAX /VISTARIL ) 50 MG tablet Take 1 tablet (50 mg total) by mouth at bedtime as needed (insomnia). Patient not taking: Reported on 10/11/2024 05/22/16   Collene Gouge I, NP  lidocaine  (LIDODERM ) 5 % Place 1 patch onto the skin daily. Remove & Discard patch within 12 hours or as directed by MD: For pain management Patient not taking: Reported on 10/11/2024 05/22/16   Collene Gouge I, NP  naproxen  (NAPROSYN ) 500 MG tablet Take  1 tablet (500 mg total) by mouth 2 (two) times daily. For mild to moderate pain Patient not taking: Reported on 10/11/2024 05/22/16   Collene Gouge I, NP  nicotine  polacrilex (NICORETTE ) 2 MG gum Take 1 each (2 mg total) by mouth as needed for smoking cessation. 05/22/16   Collene Gouge I, NP  olmesartan -hydrochlorothiazide  (BENICAR  HCT) 40-25 MG tablet Take 1 tablet by mouth daily with breakfast. For high blood pressure 10/11/24   Dreama, Danielys Madry  N, FNP  oxyCODONE  (ROXICODONE ) 5 MG immediate release tablet Take 1 tablet (5 mg total) by mouth every 4 (four) hours as needed for severe pain. Patient not taking: Reported on 10/11/2024 10/06/18   Lenor Hollering, MD   tamsulosin  (FLOMAX ) 0.4 MG CAPS capsule Take 1 capsule (0.4 mg total) by mouth daily after supper. 10/11/24   Dreama, Hipolito Martinezlopez  N, FNP    Family History History reviewed. No pertinent family history.  Social History Social History[1]   Allergies   Elemental sulfur and Sulfa antibiotics   Review of Systems Review of Systems  Per HPI  Physical Exam Triage Vital Signs ED Triage Vitals  Encounter Vitals Group     BP 10/11/24 1940 (!) 210/110     Girls Systolic BP Percentile --      Girls Diastolic BP Percentile --      Boys Systolic BP Percentile --      Boys Diastolic BP Percentile --      Pulse Rate 10/11/24 1940 79     Resp 10/11/24 1940 16     Temp 10/11/24 1940 97.9 F (36.6 C)     Temp Source 10/11/24 1940 Oral     SpO2 10/11/24 1940 97 %     Weight --      Height --      Head Circumference --      Peak Flow --      Pain Score 10/11/24 1939 5     Pain Loc --      Pain Education --      Exclude from Growth Chart --    No data found.  Updated Vital Signs BP (!) 210/110 (BP Location: Left Arm) Comment: Patient has been out of Amlodipine  x 3 days.  Pulse 79   Temp 97.9 F (36.6 C) (Oral)   Resp 16   SpO2 97%   Visual Acuity Right Eye Distance:   Left Eye Distance:   Bilateral Distance:    Right Eye Near:   Left Eye Near:    Bilateral Near:     Physical Exam Vitals and nursing note reviewed.  Constitutional:      Appearance: Normal appearance.  HENT:     Head: Normocephalic and atraumatic.     Right Ear: External ear normal.     Left Ear: External ear normal.     Nose: Nose normal.     Mouth/Throat:     Mouth: Mucous membranes are moist.  Eyes:     Conjunctiva/sclera: Conjunctivae normal.  Cardiovascular:     Rate and Rhythm: Normal rate and regular rhythm.     Heart sounds: Normal heart sounds. No murmur heard. Pulmonary:     Effort: Pulmonary effort is normal. No respiratory distress.     Breath sounds: Wheezing present.  Skin:     General: Skin is warm and dry.  Neurological:     General: No focal deficit present.     Mental Status: He is alert.  Psychiatric:        Mood  and Affect: Mood normal.      UC Treatments / Results  Labs (all labs ordered are listed, but only abnormal results are displayed) Labs Reviewed  POCT INFLUENZA A/B - Normal    EKG   Radiology No results found.  Procedures Procedures (including critical care time)  Medications Ordered in UC Medications - No data to display  Initial Impression / Assessment and Plan / UC Course  I have reviewed the triage vital signs and the nursing notes.  Pertinent labs & imaging results that were available during my care of the patient were reviewed by me and considered in my medical decision making (see chart for details).  Vitals and triage reviewed, patient is hemodynamically stable.  Lungs vesicular, heart with regular rate and rhythm.  Hypertensive in clinic, will refill antihypertensives as well as Flomax .  Staff to schedule with PCP prior to discharge.  Suspect viral URI, influenza testing negative.  Does have slight wheezing on expiration in upper lobes, will send albuterol .  No acute respiratory distress.  Able to speak in full sentences.  Symptomatic management for viral illness discussed.  Plan of care, follow-up care and return precautions given, no questions at this time.  Work note provided.    Final Clinical Impressions(s) / UC Diagnoses   Final diagnoses:  Nausea without vomiting  Essential hypertension  Viral URI with cough     Discharge Instructions      Flu testing was negative, you most likely have a different viral illness.  Use the inhaler every 6 hours as needed for any wheezing or shortness of breath.  You can use the Tessalon  Perles to help with cough suppression.  I have refilled your blood pressure medication please start taking this.  I have also refilled your Flomax  for your prostate.  It is important that  you follow-up with your primary care provider regarding any further refills and medication management.  Return to clinic for any new or urgent symptoms.      ED Prescriptions     Medication Sig Dispense Auth. Provider   albuterol  (VENTOLIN  HFA) 108 (90 Base) MCG/ACT inhaler Inhale 1-2 puffs into the lungs every 6 (six) hours as needed for wheezing or shortness of breath. 18 g Dreama, Mahala Rommel  N, FNP   tamsulosin  (FLOMAX ) 0.4 MG CAPS capsule Take 1 capsule (0.4 mg total) by mouth daily after supper. 30 capsule Dreama, Melat Wrisley  N, FNP   olmesartan -hydrochlorothiazide  (BENICAR  HCT) 40-25 MG tablet Take 1 tablet by mouth daily with breakfast. For high blood pressure 30 tablet Dreama, Nanea Jared  N, FNP   benzonatate  (TESSALON ) 100 MG capsule Take 1 capsule (100 mg total) by mouth every 8 (eight) hours. 30 capsule Dreama, Pansie Guggisberg  N, FNP      PDMP not reviewed this encounter.     [1]  Social History Tobacco Use   Smoking status: Every Day    Types: Cigarettes   Smokeless tobacco: Never  Vaping Use   Vaping status: Never Used  Substance Use Topics   Alcohol use: Yes   Drug use: Not Currently    Types: Cocaine     Dreama, Rafal Archuleta  N, FNP 10/11/24 2020  "

## 2024-10-11 NOTE — ED Triage Notes (Signed)
 Patient reports that he has had diarrhea, nausea, an intermittent cough, body aches, headache.   Patient denies taking any medications for his symptoms.

## 2024-10-11 NOTE — Discharge Instructions (Addendum)
 Flu testing was negative, you most likely have a different viral illness.  Use the inhaler every 6 hours as needed for any wheezing or shortness of breath.  You can use the Tessalon  Perles to help with cough suppression.  I have refilled your blood pressure medication please start taking this.  I have also refilled your Flomax  for your prostate.  It is important that you follow-up with your primary care provider regarding any further refills and medication management.  Return to clinic for any new or urgent symptoms.
